# Patient Record
Sex: Female | Born: 1950 | Race: Black or African American | Hispanic: No | Marital: Married | Smoking: Never smoker
Health system: Southern US, Community
[De-identification: ages and names within clinical notes are randomized; demographics above are authoritative.]

## PROBLEM LIST (undated history)

## (undated) DIAGNOSIS — R519 Headache, unspecified: Secondary | ICD-10-CM

## (undated) DIAGNOSIS — D573 Sickle-cell trait: Secondary | ICD-10-CM

## (undated) DIAGNOSIS — E039 Hypothyroidism, unspecified: Secondary | ICD-10-CM

## (undated) DIAGNOSIS — I10 Essential (primary) hypertension: Secondary | ICD-10-CM

## (undated) DIAGNOSIS — R42 Dizziness and giddiness: Secondary | ICD-10-CM

## (undated) DIAGNOSIS — Z86718 Personal history of other venous thrombosis and embolism: Secondary | ICD-10-CM

## (undated) DIAGNOSIS — D649 Anemia, unspecified: Secondary | ICD-10-CM

## (undated) DIAGNOSIS — F32A Depression, unspecified: Secondary | ICD-10-CM

## (undated) DIAGNOSIS — C801 Malignant (primary) neoplasm, unspecified: Secondary | ICD-10-CM

## (undated) DIAGNOSIS — G629 Polyneuropathy, unspecified: Secondary | ICD-10-CM

## (undated) DIAGNOSIS — R51 Headache: Secondary | ICD-10-CM

## (undated) DIAGNOSIS — I2699 Other pulmonary embolism without acute cor pulmonale: Secondary | ICD-10-CM

## (undated) DIAGNOSIS — F419 Anxiety disorder, unspecified: Secondary | ICD-10-CM

## (undated) DIAGNOSIS — F329 Major depressive disorder, single episode, unspecified: Secondary | ICD-10-CM

## (undated) DIAGNOSIS — G709 Myoneural disorder, unspecified: Secondary | ICD-10-CM

## (undated) DIAGNOSIS — K219 Gastro-esophageal reflux disease without esophagitis: Secondary | ICD-10-CM

## (undated) DIAGNOSIS — M1990 Unspecified osteoarthritis, unspecified site: Secondary | ICD-10-CM

## (undated) HISTORY — PX: DIAGNOSTIC LAPAROSCOPY: SUR761

## (undated) HISTORY — PX: KNEE ARTHROSCOPY: SHX127

## (undated) HISTORY — PX: TONSILLECTOMY: SUR1361

## (undated) HISTORY — PX: REPLACEMENT TOTAL KNEE: SUR1224

## (undated) HISTORY — PX: DILATION AND CURETTAGE OF UTERUS: SHX78

## (undated) HISTORY — PX: FOOT SURGERY: SHX648

## (undated) HISTORY — PX: COLONOSCOPY: SHX174

---

## 2001-05-30 HISTORY — PX: ESOPHAGOGASTRODUODENOSCOPY: SHX1529

## 2005-01-14 ENCOUNTER — Emergency Department: Payer: Self-pay | Admitting: Emergency Medicine

## 2005-08-10 ENCOUNTER — Ambulatory Visit: Payer: Self-pay | Admitting: Obstetrics and Gynecology

## 2005-12-09 ENCOUNTER — Ambulatory Visit: Payer: Self-pay | Admitting: Psychiatry

## 2006-02-28 ENCOUNTER — Ambulatory Visit: Payer: Self-pay | Admitting: Surgery

## 2006-03-27 ENCOUNTER — Ambulatory Visit: Payer: Self-pay | Admitting: Gerontology

## 2006-04-04 ENCOUNTER — Ambulatory Visit: Payer: Self-pay | Admitting: Gerontology

## 2006-07-12 ENCOUNTER — Other Ambulatory Visit: Payer: Self-pay

## 2006-07-13 ENCOUNTER — Ambulatory Visit: Payer: Self-pay | Admitting: Unknown Physician Specialty

## 2006-07-26 ENCOUNTER — Ambulatory Visit: Payer: Self-pay | Admitting: Internal Medicine

## 2006-07-26 ENCOUNTER — Inpatient Hospital Stay: Payer: Self-pay | Admitting: Internal Medicine

## 2007-01-01 ENCOUNTER — Ambulatory Visit: Payer: Self-pay | Admitting: Unknown Physician Specialty

## 2007-01-01 ENCOUNTER — Other Ambulatory Visit: Payer: Self-pay

## 2007-01-01 ENCOUNTER — Ambulatory Visit: Payer: Self-pay | Admitting: Obstetrics and Gynecology

## 2007-02-21 ENCOUNTER — Ambulatory Visit: Payer: Self-pay | Admitting: Internal Medicine

## 2007-02-26 ENCOUNTER — Other Ambulatory Visit: Payer: Self-pay

## 2007-02-26 ENCOUNTER — Inpatient Hospital Stay: Payer: Self-pay | Admitting: Internal Medicine

## 2007-03-14 ENCOUNTER — Ambulatory Visit: Payer: Self-pay | Admitting: Internal Medicine

## 2007-09-06 ENCOUNTER — Ambulatory Visit: Payer: Self-pay | Admitting: Internal Medicine

## 2007-09-12 ENCOUNTER — Ambulatory Visit: Payer: Self-pay | Admitting: Unknown Physician Specialty

## 2008-01-02 ENCOUNTER — Ambulatory Visit: Payer: Self-pay | Admitting: Obstetrics and Gynecology

## 2009-02-25 ENCOUNTER — Ambulatory Visit: Payer: Self-pay | Admitting: Internal Medicine

## 2009-07-28 ENCOUNTER — Emergency Department: Payer: Self-pay | Admitting: Emergency Medicine

## 2009-09-03 ENCOUNTER — Ambulatory Visit: Payer: Self-pay | Admitting: Neurology

## 2010-03-25 ENCOUNTER — Ambulatory Visit: Payer: Self-pay | Admitting: Obstetrics and Gynecology

## 2010-04-06 ENCOUNTER — Ambulatory Visit: Payer: Self-pay | Admitting: Obstetrics and Gynecology

## 2010-06-07 ENCOUNTER — Ambulatory Visit: Payer: Self-pay

## 2010-09-02 ENCOUNTER — Emergency Department: Payer: Self-pay | Admitting: Emergency Medicine

## 2010-09-04 ENCOUNTER — Emergency Department: Payer: Self-pay | Admitting: Emergency Medicine

## 2011-06-27 ENCOUNTER — Ambulatory Visit: Payer: Self-pay | Admitting: Vascular Surgery

## 2011-06-27 LAB — PROTIME-INR: INR: 2.3

## 2011-07-03 ENCOUNTER — Ambulatory Visit: Payer: Self-pay | Admitting: General Practice

## 2011-07-03 LAB — CBC
HCT: 38 % (ref 35.0–47.0)
HGB: 12.6 g/dL (ref 12.0–16.0)
MCH: 27.4 pg (ref 26.0–34.0)
MCV: 83 fL (ref 80–100)
Platelet: 240 10*3/uL (ref 150–440)
WBC: 4.8 10*3/uL (ref 3.6–11.0)

## 2011-07-03 LAB — URINALYSIS, COMPLETE
Blood: NEGATIVE
Ketone: NEGATIVE
Nitrite: NEGATIVE
Ph: 6 (ref 4.5–8.0)
Protein: NEGATIVE
RBC,UR: 2 /HPF (ref 0–5)
Specific Gravity: 1.011 (ref 1.003–1.030)
Squamous Epithelial: 6
WBC UR: 5 /HPF (ref 0–5)

## 2011-07-03 LAB — BASIC METABOLIC PANEL
BUN: 17 mg/dL (ref 7–18)
Calcium, Total: 10.5 mg/dL — ABNORMAL HIGH (ref 8.5–10.1)
Chloride: 106 mmol/L (ref 98–107)
Co2: 29 mmol/L (ref 21–32)
EGFR (Non-African Amer.): 39 — ABNORMAL LOW
Potassium: 3.9 mmol/L (ref 3.5–5.1)

## 2011-07-03 LAB — SEDIMENTATION RATE: Erythrocyte Sed Rate: 37 mm/hr — ABNORMAL HIGH (ref 0–30)

## 2011-07-03 LAB — PROTIME-INR
INR: 2.3
Prothrombin Time: 25.7 secs — ABNORMAL HIGH (ref 11.5–14.7)

## 2011-07-03 LAB — APTT: Activated PTT: 42.3 secs — ABNORMAL HIGH (ref 23.6–35.9)

## 2011-07-03 LAB — MRSA PCR SCREENING

## 2011-07-13 ENCOUNTER — Ambulatory Visit: Payer: Self-pay | Admitting: Obstetrics and Gynecology

## 2011-07-17 ENCOUNTER — Inpatient Hospital Stay: Payer: Self-pay | Admitting: General Practice

## 2011-07-17 LAB — PROTIME-INR
INR: 1
Prothrombin Time: 13.7 secs (ref 11.5–14.7)

## 2011-07-18 LAB — BASIC METABOLIC PANEL
Anion Gap: 5 — ABNORMAL LOW (ref 7–16)
BUN: 10 mg/dL (ref 7–18)
Co2: 30 mmol/L (ref 21–32)
Creatinine: 1.04 mg/dL (ref 0.60–1.30)
EGFR (African American): >60
Osmolality: 283 (ref 275–301)

## 2011-07-18 LAB — PROTIME-INR: Prothrombin Time: 15.4 secs — ABNORMAL HIGH (ref 11.5–14.7)

## 2011-07-18 LAB — HEMOGLOBIN: HGB: 10.5 g/dL — ABNORMAL LOW (ref 12.0–16.0)

## 2011-07-19 LAB — BASIC METABOLIC PANEL
BUN: 7 mg/dL (ref 7–18)
Chloride: 105 mmol/L (ref 98–107)
Creatinine: 0.95 mg/dL (ref 0.60–1.30)
EGFR (African American): >60
Sodium: 142 mmol/L (ref 136–145)

## 2011-07-19 LAB — PLATELET COUNT: Platelet: 207 10*3/uL (ref 150–440)

## 2011-07-19 LAB — HEMOGLOBIN: HGB: 10.8 g/dL — ABNORMAL LOW (ref 12.0–16.0)

## 2011-07-20 LAB — BASIC METABOLIC PANEL
Calcium, Total: 8.3 mg/dL — ABNORMAL LOW (ref 8.5–10.1)
Chloride: 104 mmol/L (ref 98–107)
Co2: 33 mmol/L — ABNORMAL HIGH (ref 21–32)
Glucose: 107 mg/dL — ABNORMAL HIGH (ref 65–99)
Osmolality: 283 (ref 275–301)
Potassium: 3.8 mmol/L (ref 3.5–5.1)
Sodium: 143 mmol/L (ref 136–145)

## 2011-07-20 LAB — PROTIME-INR: Prothrombin Time: 20.7 secs — ABNORMAL HIGH (ref 11.5–14.7)

## 2011-09-12 ENCOUNTER — Ambulatory Visit: Payer: Self-pay | Admitting: Vascular Surgery

## 2011-09-12 LAB — PROTIME-INR
INR: 2
Prothrombin Time: 22.7 secs — ABNORMAL HIGH (ref 11.5–14.7)

## 2012-05-29 DIAGNOSIS — C801 Malignant (primary) neoplasm, unspecified: Secondary | ICD-10-CM

## 2012-05-29 HISTORY — DX: Malignant (primary) neoplasm, unspecified: C80.1

## 2012-05-29 HISTORY — PX: BREAST SURGERY: SHX581

## 2012-07-24 ENCOUNTER — Ambulatory Visit: Payer: Self-pay | Admitting: Obstetrics and Gynecology

## 2012-07-27 ENCOUNTER — Ambulatory Visit: Payer: Self-pay | Admitting: Internal Medicine

## 2012-07-29 ENCOUNTER — Ambulatory Visit: Payer: Self-pay | Admitting: Obstetrics and Gynecology

## 2012-08-23 ENCOUNTER — Ambulatory Visit: Payer: Self-pay | Admitting: Internal Medicine

## 2012-08-27 ENCOUNTER — Ambulatory Visit: Payer: Self-pay | Admitting: Internal Medicine

## 2014-04-18 ENCOUNTER — Emergency Department: Payer: Self-pay | Admitting: Emergency Medicine

## 2014-04-28 ENCOUNTER — Ambulatory Visit: Payer: Self-pay | Admitting: Anesthesiology

## 2014-04-28 DIAGNOSIS — Z01810 Encounter for preprocedural cardiovascular examination: Secondary | ICD-10-CM

## 2014-04-28 DIAGNOSIS — I10 Essential (primary) hypertension: Secondary | ICD-10-CM

## 2014-04-28 LAB — PROTIME-INR
INR: 2.1
Prothrombin Time: 23.1 secs — ABNORMAL HIGH (ref 11.5–14.7)

## 2014-04-28 LAB — POTASSIUM: POTASSIUM: 4 mmol/L (ref 3.5–5.1)

## 2014-05-11 ENCOUNTER — Ambulatory Visit: Payer: Self-pay | Admitting: General Practice

## 2014-05-11 LAB — PROTIME-INR
INR: 1.1
PROTHROMBIN TIME: 13.9 s (ref 11.5–14.7)

## 2014-09-19 NOTE — Op Note (Signed)
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   PROCEDURE PERFORMED: Right knee arthroscopy and extensive synovectomy.   SURGEON: Skip Estimable, MD   ANESTHESIA: General.   ESTIMATED BLOOD LOSS: Minimal.   TOURNIQUET TIME: Not used.   DRAINS: None.   INDICATIONS FOR SURGERY: The patient is a 64 year old female who underwent a right total knee arthroplasty several years ago. Serial x-rays have been unremarkable. However, she has developed crepitance with range of motion. She did not see any significant improvement despite attempts at quadriceps strengthening and other conservative measures. After discussion of the risks and benefits of surgical intervention, the patient expressed understanding of the risks and benefits and agreed with plans for surgical intervention.   PROCEDURE IN DETAIL: The patient was brought to the operating room and, after adequate general anesthesia was achieved, a tourniquet was placed on the patient's right thigh and leg was placed in a leg holder. All bony prominences were well padded. The patient's right knee and leg were cleaned and prepped with alcohol and DuraPrep and draped in the usual sterile fashion. A "timeout" was performed as per usual protocol. The anticipated portal sites were injected with 0.25% Marcaine with epinephrine. An anterolateral portal was created and a cannula was inserted. No significant effusion was appreciated. The scope was inserted and the knee was distended with fluid using the pump. Under visualization with the scope, an anteromedial portal was created and a 4.5 mm incisor shaver was inserted. There was noted to be some thickened fibrotic synovial tissue along the anterior aspect of the medial and  lateral gutters. These areas were debrided using a combination of 4.5 mm shaver with hemostasis achieved using the 50 degree ArthroCare wand. The implants were inspected and no unusual findings were appreciated. The knee was extended and significant amount of synovial tissue was noted about the patella. This was carefully debrided using a combination of a 4.5 mm shaver and the 50 degree ArthroCare wand. Most notably was an area to the superior aspect of the patellar component, which extended down and could be seen becoming entrapped within the notch of the implant. The synovial tissue was debrided using a combination of 4.5 mm incisor shaver and 50 degree ArthroCare wand. Excellent patellar tracking was appreciated. Both the medial and lateral gutters were again visualized with no significant fibrotic or synovial hypertrophy remaining. The wound was irrigated with copious amounts of fluid and then suctioned dry. The anteromedial portal was reapproximated using #3-0 nylon. A combination of 0.25% Marcaine with epinephrine and 4 mg of morphine was injected via the scope. The scope was removed and the anterolateral portal was reapproximated using #3-0 nylon. A sterile dressing was applied followed by application of an ice wrap.   The patient tolerated the procedure well. She was transported to the recovery room in stable condition.  ____________________________ Laurice Record. Holley Bouche., MD jph:sb D: 05/12/2014 06:28:02 ET T: 05/12/2014 09:19:15 ET JOB#: [id number]  cc: Laurice Record. Holley Bouche., MD, <Dictator> Laurice Record Holley Bouche MD ELECTRONICALLY SIGNED 05/12/2014 19:46

## 2014-09-20 NOTE — Discharge Summary (Signed)
PATIENT NAME:  Alexis Atkins, Alexis Atkins MR#:  [id number] DATE OF BIRTH:  1950/06/13  DATE OF ADMISSION:  07/17/2011 DATE OF DISCHARGE:  07/21/2011   ADMITTING DIAGNOSIS: Degenerative arthrosis of right knee.   DISCHARGE DIAGNOSIS: Degenerative arthrosis of right knee.   HISTORY: The patient is a 64 year old female who has been followed at [facility] for progression of right knee pain. She reported a several year history of bilateral knee pain with the right being more symptomatic than the left. The patient had previously underwent a right knee arthroscopy for partial medial and lateral meniscectomy as well as chondroplasty in 2008 performed by Dr. Albesa Seen. 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 Her postop course was complicated secondary to deep vein thrombosis as well as a pulmonary embolus. The patient states that the pain has increased in severity since that time. She localized most of the pain along the medial joint line as well as along the anterior aspect of the knee. She was having difficulty with ascending and descending stairs. She also complained of difficulty getting up and down out of a chair. She does report some crepitus with range of motion. At the time of surgery, she was not using any ambulatory aid. The patient has noted some swelling in the knee as well as near giving way. The pain had progressed to the point that it was significantly interfering with her activities of daily living. The patient did not see any significant improvement in her condition despite cortisone injections as well as Synvisc injections. X-rays taken in the clinic showed narrowing of the medial cartilage space with associated varus alignment. She was noted to have posterior osteophyte formation. Subchondral sclerosis was noted. There was significant narrowing to the patellofemoral articulation specifically along the lateral facet  region. After discussion of the risks and benefits of surgical intervention, the patient expressed her understanding of the risks and benefits and agreed with plans for surgical intervention.   PROCEDURE: Right total knee arthroplasty using computer-assisted navigation.   ANESTHESIA: Femoral nerve block with spinal.   SOFT TISSUE RELEASE: Anterior cruciate ligament, posterior cruciate ligament, deep medial collateral ligaments as well as the patellofemoral ligament.   IMPLANTS UTILIZED: DePuy PFC Sigma size 3 posterior stabilized femoral component (cemented), size 3 MBT tibial component (cemented), 35 mm three pegged oval dome patella (cemented), and a 10 mm stabilized rotating platform polyethylene insert.   HOSPITAL COURSE: The patient tolerated the procedure very well. She had no complications. She was then taken to PAC-U where she was stabilized and then transferred to the orthopedic floor. The patient began receiving anticoagulation therapy of Lovenox 30 mg sub-Q q.12 hours per anesthesia protocol as well as 10 mg of Coumadin. Her INR was monitored. This slowly returned to a therapeutic level of greater than 2.0 on discharge. Subsequently, her Lovenox was discontinued. She resumed taking her regular regimen of Coumadin upon discharge. The patient was fitted with TED stockings bilaterally. These were allowed to be removed one hour per eight hour shift. She was also fitted with AVI compression foot pumps bilaterally set at 80 mmHg. Calves have been nontender and free of any evidence of any deep venous thromboses. Heels were elevated off the bed using rolled towels. Heels have been nontender with no tissue breakdown noted.   The patient has denied any chest pain or shortness of breath. Vital signs have been stable. She has been afebrile. Hemodynamically she was  stable. No transfusions were given other than the Autovac transfusions given the first six hours postoperatively.   Physical therapy was  initiated on day one for gait training and transfers. Initially she was noted to have very good range of motion but very slow on progression of ambulation. However, upon being discharged she was ambulating greater than 260 feet. She was independent with bed-to-chair transfers. She was able to go up and down four sets of steps.   Occupational therapy was also initiated on day one for activities of daily living and assistive devices.   The patient was noted be somewhat lethargic during the hospital stay. Subsequently, her Ultram was discontinued. The husband felt that this was the cause of some of her drowsiness. After discontinuing this did not appear to change a lot in the way her mental status with discontinuation of the Ultram.   The patient's IV, Foley, and catheter were all removed on day two along with a dressing change. The Polar Care was reapplied to the surgical leg maintaining a temperature of 40 to 50 degrees Fahrenheit. There was no evidence or sign of any infection.   DISPOSITION: The patient is being discharged to home in improved stable condition.   DISCHARGE INSTRUCTIONS:  1. She will continue weightbearing as tolerated.  2. Continue using a walker until cleared by physical therapy to go to a quad cane.  3. She will receive home health PT.  4. She has a follow-up appointment on March 5th. She is to call the clinic sooner if any temperatures of 101.5 or greater or excessive bleeding.  5. She is to continue with TED stockings. These are to be worn during the day but may be removed at night.  6. Continue Polar Care maintaining a temperature of 40 to 50 degrees Fahrenheit.  7. She is placed on a regular diet.  8. She is to resume her regular medication that she was on prior to admission. She was given a prescription for oxycodone 5 to 10 mg q.4 to 6 hours p.r.n. for pain.   PAST MEDICAL HISTORY:  1. Chickenpox. 2. Depression. 3. DVT. 4. Pulmonary embolus.   5. Hypertension. 6. Migraine headaches.  7. Hypothyroidism.   ____________________________ Vance Peper, PA jrw:drc D: 07/21/2011 07:10:32 ET T: 07/21/2011 09:40:27 ET JOB#: [id number]  cc: Vance Peper, PA, <Dictator> Sabria Florido PA ELECTRONICALLY SIGNED 07/22/2011 20:05

## 2014-09-20 NOTE — Op Note (Signed)
PATIENT NAME:  Alexis Atkins, Alexis Atkins MR#:  [id number] DATE OF BIRTH:  Apr 12, 1951  DATE OF PROCEDURE:  09/12/2011  PREOPERATIVE DIAGNOSIS: Deep venous thrombosis with pulmonary embolus.   POSTOPERATIVE DIAGNOSIS: Deep venous thrombosis with pulmonary embolus.  PROCEDURES PERFORMED:  1. Inferior venacavogram.  2. Removal of infrarenal inferior vena caval filter, Meridian type.   SURGEON: Katha Cabal, MD   SEDATION: Versed 4 mg plus fentanyl 150 mcg administered IV. Continuous ECG, pulse oximetry and cardiopulmonary monitoring is performed throughout the entire procedure by the Interventional Radiology nurse. Total sedation time was 30 minutes.   ACCESS: 10-French  sheath, right IJ.   FLUOROSCOPY TIME:  2.3 minutes.   CONTRAST USED: Isovue 15 mL.   INDICATIONS: [name] is a 64 year old woman who suffered pulmonary embolism associated with a deep venous thrombosis several months ago. She has done well since that time. An IVC filter was placed to prevent further potentially lethal pulmonary emboli, and it is now to be removed to prevent future complications. The risks and benefits were reviewed. All questions were answered. The patient has agreed to proceed.   DESCRIPTION OF PROCEDURE: The patient is taken to Special Procedures and placed in the supine position. After adequate sedation is achieved, the right neck is prepped and draped in a sterile fashion. Ultrasound is placed in a sterile sleeve. Ultrasound is utilized secondary to a lack of appropriate landmarks and to avoid vascular injury. Under direct ultrasound visualization, the jugular vein is identified. It is echolucent, homogeneous, and easily compressible indicating patency. Image is recorded for the permanent record. Under real-time ultrasound guidance, a Seldinger needle is inserted into the jugular vein. The J-wire is then advanced under fluoroscopy without difficulty, and a retrieval sheath is advanced past the filter down to the  iliac confluence. Bolus injection of contrast is then utilized to demonstrate the inferior vena cava and filter. Filter's orientation is upright, and there is no evidence of thrombus within the filter or within the IVC and visualized portions of the iliac veins.   The sheath is repositioned. The snare is extended. It is used to loop the retrieval hook, and the filter subsequently collapsed without difficulty and is removed. Pressure is held at the sheath insertion site in the neck, and there are no immediate complications.   INTERPRETATION: The inferior vena cava is widely patent. No evidence of thrombus. The filter is in good orientation.   SUMMARY: Successful removal of infrarenal IVC filter.   ____________________________ Katha Cabal, MD ggs:cbb D: 09/12/2011 10:45:10 ET T: 09/12/2011 11:12:19 ET JOB#: [id number]  cc: Katha Cabal, MD, <Dictator> Katha Cabal MD ELECTRONICALLY SIGNED 09/17/2011 13:55

## 2014-09-20 NOTE — Op Note (Signed)
PATIENT NAME:  [name], [name] MR#:  [id number] DATE OF BIRTH:  [birth year]  DATE OF PROCEDURE:  06/27/2011  PREOPERATIVE DIAGNOSES:  1. History of deep venous thrombosis.  2. History of bilateral severe pulmonary embolisms.  3. Degenerative joint disease requiring knee surgery, high risk procedure.   POSTOPERATIVE DIAGNOSES:  1. History of deep venous thrombosis.  2. History of bilateral severe pulmonary embolisms.  3. Degenerative joint disease requiring knee surgery, high risk procedure.   PROCEDURES PERFORMED:  1. Inferior venacavogram.  2. Placement of infrarenal inferior vena caval filter.   SURGEON: Katha Cabal, MD  SEDATION: Versed 3 mg with fentanyl 100 mcg administered IV. Continuous ECG, pulse oximetry and cardiopulmonary monitoring was performed throughout the entire procedure by the interventional radiology nurse. Total sedation time was 45 minutes.   ACCESS: 9.5 French sheath, right common femoral vein.   CONTRAST USED: Isovue 15 mL.   FLUOROSCOPY TIME: 0.6 minutes.   INDICATIONS: Ms. Alexis Atkins is a 64 year old woman who will require knee surgery which is high risk for recurrent thrombotic episodes. 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 She is therefore requiring placement of an IVC filter for protection from lethal pulmonary embolism in perioperative procedure. She has been informed that the plan will be to remove this filter in approximately four months. She agrees with this plan.   DESCRIPTION OF PROCEDURE: Patient is taken to special procedures, placed in supine position. After adequate sedation is achieved, right groin is prepped and draped in sterile fashion. Ultrasound is placed in a sterile sleeve. Femoral vein is identified. It is echolucent and compressible, indicating patency. Image is recorded for the permanent record. Under direct ultrasound visualization after 1% lidocaine has been infiltrated, Seldinger needle is inserted into the  anterior wall of the common femoral vein. J-wire is then easily advanced. Dilator is advanced up to the iliac confluence and bolus injection of contrast used to demonstrate the inferior vena cava. After evaluation of the image the wire is reintroduced. The sheath is repositioned to the L3 level and the filter is then deployed at the L3 level based on the location of the renal blushes. Filter is in good orientation. Filter is removed, pressure is held. A safeguard is placed. There are no immediate complications.   INTERPRETATION: Inferior vena cava is opacified with a bolus injection of contrast, appears patent, renal blushes are at the L2 level. Filter is deployed at the L3 level. Cava measures approximately 20 mm in diameter.   SUMMARY: Successful placement of infrarenal inferior vena caval filter.   ____________________________ Katha Cabal, MD ggs:cms D: 06/27/2011 08:55:54 ET T: 06/27/2011 10:17:44 ET JOB#: [id number]  cc: Katha Cabal, MD, <Dictator> Katha Cabal MD ELECTRONICALLY SIGNED 07/12/2011 11:24

## 2014-09-20 NOTE — Op Note (Signed)
PATIENT NAME:  Alexis Atkins, Alexis Atkins MR#:  [id number] DATE OF BIRTH:  1951-02-20  DATE OF PROCEDURE:  07/17/2011  PREOPERATIVE DIAGNOSIS: Degenerative arthrosis of the right knee.   POSTOPERATIVE DIAGNOSIS: Degenerative arthrosis of the right knee.   PROCEDURE PERFORMED: Right total knee arthroplasty using computer-assisted navigation.   SURGEON:  Laurice Record. Holley Bouche., MD   ASSISTANT: Vance Peper, PA-C (required to maintain retraction throughout the procedure)   ANESTHESIA: Femoral nerve block and spinal.   ESTIMATED BLOOD LOSS: 50 mL.   FLUIDS REPLACED: 1000 mL of crystalloid.   TOURNIQUET TIME: 81 minutes.   DRAINS: Two medium drains to reinfusion system.   SOFT TISSUE RELEASES: Anterior cruciate ligament, posterior cruciate ligament, deep medial collateral ligament, patellofemoral ligament.   IMPLANTS UTILIZED: DePuy PFC Sigma size 3 posterior stabilized femoral component (cemented), size 3 MBT tibial component (cemented), 35 mm three-peg oval dome patella (cemented), and a 10 mm stabilized rotating platform polyethylene insert.   INDICATIONS FOR SURGERY: The patient is a 64 year old female who has been seen for complaints of progressive right knee pain. X-rays demonstrated significant degenerative changes involving the medial and the patellofemoral compartments. She did not see any significant improvement despite conservative nonsurgical intervention. After discussion of the risks and benefits of surgical intervention, the patient expressed her understanding of the risks and benefits and agreed with plans for surgical intervention.   PROCEDURE IN DETAIL: The patient was brought into the Operating Room, and after adequate femoral nerve block and spinal anesthesia was achieved, a tourniquet was placed on the patient's upper right thigh. The patient's right knee and leg were cleaned and prepped with alcohol and DuraPrep and draped in the usual sterile fashion. A "timeout" was performed as per  usual protocol. The right lower extremity was exsanguinated using an Esmarch, and the tourniquet was inflated to 300 mmHg. An anterior longitudinal incision was made, followed by a standard mid vastus approach. A moderate effusion was evacuated. Deep fibers of the medial and collateral ligament were elevated in a subperiosteal fashion off the medial flare of the tibia so as to maintain a continuous soft tissue sleeve. The patella was subluxed laterally, and the patellofemoral ligament was incised. Inspection of the knee demonstrated severe degenerative changes to the intercondylar notch as well as to the patella and medial compartment. Anterior and posterior cruciate ligaments were excised. Prominent osteophytes were debrided using a rongeur. Two 4.0 mm Schanz pins were inserted into the femur and into the tibia for attachment of the array of spheres used for computer-assisted navigation. Hip center was identified using a circumduction technique. Distal landmarks were mapped using the computer. The distal femur and proximal tibia were mapped using the computer. A distal femoral cutting guide was positioned using computer-assisted navigation so as to achieve a 5-degree distal valgus cut. Cut was performed and verified using the computer. The distal femur was sized, and it was felt that a size 3 femoral component was appropriate. A size 3 cutting guide was positioned using computer-assisted navigation, and the anterior cut was performed and verified using the computer. This was followed by completion of the posterior and chamfer cuts. A femoral cutting guide for the central box was then positioned, and the central box cut was performed.   Attention was then directed to the proximal tibia. The medial and lateral menisci were excised. The extramedullary tibial cutting guide was positioned using computer-assisted navigation so as to achieve 0-degree varus-valgus alignment and 0-degree posterior slope. A cut was  performed and verified  using the computer. The proximal tibia was sized, and it was felt that a size 3 tray was appropriate. Size 3 tibial and femoral trials were placed, followed by insertion of a 10 mm polyethylene insert. Excellent medial and lateral soft tissue balancing was appreciated both in full extension and in 90 degrees of flexion. Finally, the patella was cut and prepared so as to accommodate a 35 mm three-peg oval dome patella. A patellar trial was placed, and the knee was placed through a range of motion with excellent patellar tracking appreciated.   The femoral trial was removed. A central posthole for the tibial component was reamed, followed by insertion of a keel punch. The tibial trial was then removed. The cut surfaces of bone were irrigated with copious amounts of normal saline with antibiotic solution using pulsatile lavage and then suctioned dry. Polymethylmethacrylate cement was prepared in the usual fashion using a vacuum mixer. Cement was applied to the cut surface of the proximal tibia as well as along the undersurface of a size 3 MBT tibial component. The tibial component was positioned and impacted into place. Excess cement was removed using freer elevators. Cement was then applied to the cut surface of the femur as well as along the posterior flanges of a size 3 posterior stabilized femoral component. The femoral component was positioned and impacted into place. Excess cement was removed using freer elevators. A 10 mm polyethylene trial was inserted, and the knee was brought in full extension with steady axial compression applied. Finally, cement was applied to the backside of a 35 mm three-peg oval dome patella, and the patellar component was positioned and patellar clamp applied. Excess cement was removed using freer elevators.   After adequate curing of cement, the tourniquet was deflated after a total tourniquet time of 81 minutes. Hemostasis was achieved using electrocautery.  The knee was irrigated with copious amounts of normal saline with antibiotic solution using pulsatile lavage and then suctioned dry. The knee was inspected for any residual cement debris; 30 mL of 0.25% Marcaine with epinephrine was injected along the posterior capsule. A 10 mm stabilized rotating platform polyethylene insert was inserted, and the knee was placed through a range of motion. Again, excellent medial and lateral soft tissue balancing was appreciated and excellent patellar tracking appreciated. Two medium drains were placed in the wound bed and brought out through a separate stab incision to be attached to a reinfusion system. The medial and parapatellar portion of the incision was reapproximated using interrupted sutures of #1 Vicryl. The subcutaneous tissue was approximated in layers using first #0 Vicryl, followed by #2-0 Vicryl. The skin was closed with skin staples. A sterile dressing was applied.   The patient tolerated the procedure well. She was transported to the recovery room in stable condition.    ____________________________ Laurice Record. Holley Bouche., MD jph:cbb D: 07/17/2011 10:45:44 ET T: 07/17/2011 11:48:11 ET JOB#: [id number]  cc: Jeneen Rinks P. Holley Bouche., MD, <Dictator> Laurice Record Holley Bouche MD ELECTRONICALLY SIGNED 07/20/2011 19:06

## 2015-07-15 ENCOUNTER — Other Ambulatory Visit: Payer: Self-pay | Admitting: Internal Medicine

## 2015-07-15 DIAGNOSIS — R1012 Left upper quadrant pain: Secondary | ICD-10-CM

## 2015-07-19 ENCOUNTER — Ambulatory Visit
Admission: RE | Admit: 2015-07-19 | Discharge: 2015-07-19 | Disposition: A | Discharge status: Home / Self Care | Payer: Medicare Other | Source: Ambulatory Visit | Attending: Internal Medicine | Admitting: Internal Medicine

## 2015-07-19 DIAGNOSIS — R1012 Left upper quadrant pain: Secondary | ICD-10-CM | POA: Insufficient documentation

## 2015-07-19 MED ORDER — IOHEXOL 300 MG/ML  SOLN
125.0000 mL | Freq: Once | INTRAMUSCULAR | Status: AC | PRN
  Administered 2015-07-19: 125 mL via INTRAVENOUS

## 2015-07-23 ENCOUNTER — Ambulatory Visit: Payer: Medicare Other

## 2016-06-29 ENCOUNTER — Other Ambulatory Visit: Payer: Self-pay | Admitting: Internal Medicine

## 2016-06-29 DIAGNOSIS — G8929 Other chronic pain: Secondary | ICD-10-CM

## 2016-06-29 DIAGNOSIS — R51 Headache: Principal | ICD-10-CM

## 2016-07-11 ENCOUNTER — Ambulatory Visit
Admission: RE | Admit: 2016-07-11 | Discharge: 2016-07-11 | Disposition: A | Discharge status: Home / Self Care | Payer: Medicare Other | Source: Ambulatory Visit | Attending: Internal Medicine | Admitting: Internal Medicine

## 2016-07-11 DIAGNOSIS — J328 Other chronic sinusitis: Secondary | ICD-10-CM | POA: Insufficient documentation

## 2016-07-11 DIAGNOSIS — R51 Headache: Secondary | ICD-10-CM | POA: Insufficient documentation

## 2016-07-11 DIAGNOSIS — G8929 Other chronic pain: Secondary | ICD-10-CM

## 2016-07-17 NOTE — Discharge Instructions (Signed)

## 2016-07-18 ENCOUNTER — Ambulatory Visit: Payer: Medicare Other | Admitting: Anesthesiology

## 2016-07-18 ENCOUNTER — Ambulatory Visit
Admission: RE | Admit: 2016-07-18 | Discharge: 2016-07-18 | Disposition: A | Discharge status: Home / Self Care | Payer: Medicare Other | Source: Ambulatory Visit | Attending: Otolaryngology | Admitting: Otolaryngology

## 2016-07-18 ENCOUNTER — Encounter
Admission: RE | Disposition: A | Discharge status: Home / Self Care | Payer: Self-pay | Source: Ambulatory Visit | Attending: Otolaryngology

## 2016-07-18 DIAGNOSIS — Z833 Family history of diabetes mellitus: Secondary | ICD-10-CM | POA: Insufficient documentation

## 2016-07-18 DIAGNOSIS — G4489 Other headache syndrome: Secondary | ICD-10-CM

## 2016-07-18 DIAGNOSIS — Z79899 Other long term (current) drug therapy: Secondary | ICD-10-CM | POA: Insufficient documentation

## 2016-07-18 DIAGNOSIS — Z886 Allergy status to analgesic agent status: Secondary | ICD-10-CM | POA: Insufficient documentation

## 2016-07-18 DIAGNOSIS — Z885 Allergy status to narcotic agent status: Secondary | ICD-10-CM | POA: Diagnosis not present

## 2016-07-18 DIAGNOSIS — I10 Essential (primary) hypertension: Secondary | ICD-10-CM | POA: Insufficient documentation

## 2016-07-18 DIAGNOSIS — E0590 Thyrotoxicosis, unspecified without thyrotoxic crisis or storm: Secondary | ICD-10-CM | POA: Diagnosis not present

## 2016-07-18 DIAGNOSIS — Z888 Allergy status to other drugs, medicaments and biological substances status: Secondary | ICD-10-CM | POA: Diagnosis not present

## 2016-07-18 DIAGNOSIS — Z8249 Family history of ischemic heart disease and other diseases of the circulatory system: Secondary | ICD-10-CM | POA: Diagnosis not present

## 2016-07-18 DIAGNOSIS — D573 Sickle-cell trait: Secondary | ICD-10-CM | POA: Diagnosis not present

## 2016-07-18 HISTORY — DX: Sickle-cell trait: D57.3

## 2016-07-18 HISTORY — DX: Hypothyroidism, unspecified: E03.9

## 2016-07-18 HISTORY — DX: Headache: R51

## 2016-07-18 HISTORY — DX: Essential (primary) hypertension: I10

## 2016-07-18 HISTORY — DX: Headache, unspecified: R51.9

## 2016-07-18 HISTORY — DX: Personal history of other venous thrombosis and embolism: Z86.718

## 2016-07-18 HISTORY — PX: ARTERY BIOPSY: SHX891

## 2016-07-18 HISTORY — DX: Malignant (primary) neoplasm, unspecified: C80.1

## 2016-07-18 HISTORY — DX: Myoneural disorder, unspecified: G70.9

## 2016-07-18 HISTORY — DX: Dizziness and giddiness: R42

## 2016-07-18 SURGERY — BIOPSY TEMPORAL ARTERY
Anesthesia: Monitor Anesthesia Care | Site: Head | Laterality: Left | Wound class: Clean

## 2016-07-18 MED ORDER — PROPOFOL 10 MG/ML IV BOLUS
INTRAVENOUS | Status: DC | PRN
  Administered 2016-07-18: 150 mg via INTRAVENOUS
  Administered 2016-07-18: 50 mg via INTRAVENOUS

## 2016-07-18 MED ORDER — ACETAMINOPHEN 160 MG/5ML PO SOLN: 325.0000 mg | ORAL | Status: DC | PRN

## 2016-07-18 MED ORDER — DEXAMETHASONE SODIUM PHOSPHATE 4 MG/ML IJ SOLN
INTRAMUSCULAR | Status: DC | PRN
  Administered 2016-07-18: 8 mg via INTRAVENOUS

## 2016-07-18 MED ORDER — DOUBLE ANTIBIOTIC 500-10000 UNIT/GM EX OINT
TOPICAL_OINTMENT | CUTANEOUS | Status: DC | PRN
  Administered 2016-07-18: 1 via TOPICAL

## 2016-07-18 MED ORDER — FENTANYL CITRATE (PF) 100 MCG/2ML IJ SOLN
INTRAMUSCULAR | Status: DC | PRN
  Administered 2016-07-18 (×2): 25 ug via INTRAVENOUS

## 2016-07-18 MED ORDER — ACETAMINOPHEN 325 MG PO TABS: 325.0000 mg | ORAL_TABLET | ORAL | Status: DC | PRN

## 2016-07-18 MED ORDER — MIDAZOLAM HCL 5 MG/5ML IJ SOLN
INTRAMUSCULAR | Status: DC | PRN
  Administered 2016-07-18: 2 mg via INTRAVENOUS

## 2016-07-18 MED ORDER — LACTATED RINGERS IV SOLN: 500.0000 mL | INTRAVENOUS | Status: DC

## 2016-07-18 MED ORDER — LACTATED RINGERS IV SOLN
INTRAVENOUS | Status: DC
  Administered 2016-07-18: 08:00:00 via INTRAVENOUS

## 2016-07-18 MED ORDER — LIDOCAINE-EPINEPHRINE 1 %-1:100000 IJ SOLN
INTRAMUSCULAR | Status: DC | PRN
Start: 2016-07-18 — End: 2016-07-18
  Administered 2016-07-18: 1 mL

## 2016-07-18 MED ORDER — LIDOCAINE HCL (CARDIAC) 20 MG/ML IV SOLN
INTRAVENOUS | Status: DC | PRN
  Administered 2016-07-18: 50 mg via INTRATRACHEAL

## 2016-07-18 MED ORDER — ONDANSETRON HCL 4 MG/2ML IJ SOLN
INTRAMUSCULAR | Status: DC | PRN
  Administered 2016-07-18: 4 mg via INTRAVENOUS

## 2016-07-18 MED ORDER — FENTANYL CITRATE (PF) 100 MCG/2ML IJ SOLN: 25.0000 ug | INTRAMUSCULAR | Status: DC | PRN

## 2016-07-18 SURGICAL SUPPLY — 31 items
BACTOSHIELD CHG 4% 4OZ (MISCELLANEOUS)
BLADE SURG 15 STRL LF DISP TIS (BLADE) IMPLANT
BLADE SURG 15 STRL SS (BLADE)
CANISTER SUCT 1200ML W/VALVE (MISCELLANEOUS) ×3 IMPLANT
CORD BIP STRL DISP 12FT (MISCELLANEOUS) ×3 IMPLANT
DERMABOND ADVANCED (GAUZE/BANDAGES/DRESSINGS)
DERMABOND ADVANCED .7 DNX12 (GAUZE/BANDAGES/DRESSINGS) IMPLANT
DRESSING TELFA 4X3 1S ST N-ADH (GAUZE/BANDAGES/DRESSINGS) ×3 IMPLANT
DRSG TEGADERM 2-3/8X2-3/4 SM (GAUZE/BANDAGES/DRESSINGS) IMPLANT
ELECT CAUTERY NEEDLE 2.0 MIC (NEEDLE) IMPLANT
GAUZE SPONGE 4X4 12PLY STRL (GAUZE/BANDAGES/DRESSINGS) IMPLANT
GLOVE BIO SURGEON STRL SZ7.5 (GLOVE) ×6 IMPLANT
KIT ROOM TURNOVER OR (KITS) ×3 IMPLANT
NS IRRIG 500ML POUR BTL (IV SOLUTION) ×3 IMPLANT
PACK DRAPE NASAL/ENT (PACKS) ×3 IMPLANT
PAD GROUND ADULT SPLIT (MISCELLANEOUS) IMPLANT
PENCIL ELECTRO HAND CTR (MISCELLANEOUS) IMPLANT
SCRUB CHG 4% DYNA-HEX 4OZ (MISCELLANEOUS) IMPLANT
SOL PREP PVP 2OZ (MISCELLANEOUS) ×3
SOLUTION PREP PVP 2OZ (MISCELLANEOUS) ×1 IMPLANT
SPONGE KITTNER 5P (MISCELLANEOUS) ×3 IMPLANT
STRAP BODY AND KNEE 60X3 (MISCELLANEOUS) ×3 IMPLANT
SUCTION FRAZIER TIP 10 FR DISP (SUCTIONS) IMPLANT
SUT PLAIN GUT FAST 5-0 (SUTURE) ×3 IMPLANT
SUT SILK 3 0 (SUTURE) ×2
SUT SILK 3-0 18XBRD TIE 12 (SUTURE) ×1 IMPLANT
SUT VIC AB 4-0 FS2 27 (SUTURE) IMPLANT
SUT VIC AB 4-0 RB1 18 (SUTURE) IMPLANT
SUT VIC AB 4-0 RB1 27 (SUTURE) ×2
SUT VIC AB 4-0 RB1 27X BRD (SUTURE) ×1 IMPLANT
SYR BULB IRRIG 60ML STRL (SYRINGE) IMPLANT

## 2016-07-18 NOTE — Anesthesia Preprocedure Evaluation (Addendum)
Anesthesia Evaluation  Patient identified by MRN, date of birth, ID band Patient awake    Reviewed: Allergy & Precautions, H&P , NPO status , Patient's Chart, lab work & pertinent test results, reviewed documented beta blocker date and time   Airway Mallampati: III  TM Distance: >3 FB Neck ROM: full    Dental no notable dental hx.    Pulmonary neg pulmonary ROS,    Pulmonary exam normal breath sounds clear to auscultation       Cardiovascular Exercise Tolerance: Good hypertension, On Medications  Rhythm:regular Rate:Normal     Neuro/Psych negative neurological ROS  negative psych ROS   GI/Hepatic negative GI ROS, Neg liver ROS,   Endo/Other  Hypothyroidism   Renal/GU negative Renal ROS  negative genitourinary   Musculoskeletal   Abdominal   Peds  Hematology  (+) Blood dyscrasia, Sickle cell trait ,   Anesthesia Other Findings   Reproductive/Obstetrics negative OB ROS                            Anesthesia Physical Anesthesia Plan  ASA: III  Anesthesia Plan: General   Post-op Pain Management:    Induction:   Airway Management Planned:   Additional Equipment:   Intra-op Plan:   Post-operative Plan:   Informed Consent: I have reviewed the patients History and Physical, chart, labs and discussed the procedure including the risks, benefits and alternatives for the proposed anesthesia with the patient or authorized representative who has indicated his/her understanding and acceptance.     Plan Discussed with: CRNA  Anesthesia Plan Comments:       Anesthesia Quick Evaluation

## 2016-07-18 NOTE — Anesthesia Postprocedure Evaluation (Signed)
Anesthesia Post Note  Patient: Alexis Atkins  Procedure(s) Performed: Procedure(s) (LRB): BIOPSY TEMPORAL ARTERY (Left)  Patient location during evaluation: PACU Anesthesia Type: General Level of consciousness: awake and alert Pain management: pain level controlled Vital Signs Assessment: post-procedure vital signs reviewed and stable Respiratory status: spontaneous breathing, nonlabored ventilation, respiratory function stable and patient connected to nasal cannula oxygen Cardiovascular status: blood pressure returned to baseline and stable Postop Assessment: no signs of nausea or vomiting Anesthetic complications: no    Giovanna Kemmerer D Ariadne Rissmiller

## 2016-07-18 NOTE — Transfer of Care (Signed)
Immediate Anesthesia Transfer of Care Note  Patient: Alexis Atkins  Procedure(s) Performed: Procedure(s): BIOPSY TEMPORAL ARTERY (Left)  Patient Location: PACU  Anesthesia Type: MAC  Level of Consciousness: awake, alert  and patient cooperative  Airway and Oxygen Therapy: Patient Spontanous Breathing and Patient connected to supplemental oxygen  Post-op Assessment: Post-op Vital signs reviewed, Patient's Cardiovascular Status Stable, Respiratory Function Stable, Patent Airway and No signs of Nausea or vomiting  Post-op Vital Signs: Reviewed and stable  Complications: No apparent anesthesia complications

## 2016-07-18 NOTE — Anesthesia Procedure Notes (Signed)
Procedure Name: LMA Insertion Date/Time: 07/18/2016 9:16 AM Performed by: Londell Moh Pre-anesthesia Checklist: Patient identified, Emergency Drugs available, Suction available, Timeout performed and Patient being monitored Patient Re-evaluated:Patient Re-evaluated prior to inductionOxygen Delivery Method: Circle system utilized Preoxygenation: Pre-oxygenation with 100% oxygen Intubation Type: IV induction LMA: LMA inserted LMA Size: 4.0 Number of attempts: 1 Placement Confirmation: positive ETCO2 and breath sounds checked- equal and bilateral Tube secured with: Tape

## 2016-07-18 NOTE — H&P (Signed)
History and physical reviewed and will be scanned in later. No change in medical status reported by the patient or family, appears stable for surgery. All questions regarding the procedure answered, and patient (or family if a child) expressed understanding of the procedure.  Alexis Atkins @TODAY@ 

## 2016-07-18 NOTE — Op Note (Signed)
07/18/2016  10:23 AM    Alexis Atkins  TO:[id number]   Pre-Op Diagnosis:  Chronic left temporal headaches  Post-op Diagnosis: Same  Procedure:   Left temporal artery biopsy  Surgeon:  Riley Nearing  Anesthesia:  General endotracheal  EBL:  Minimal  Complications:  None  Findings: 2 cm segment of left temporal artery was biopsied.  Procedure: After the patient was identified in holding and the procedure was reviewed.  The patient was taken to the operating room and with the patient in a comfortable supine position,  general endotracheal anesthesia was induced without difficulty.  A proper time-out was performed, confirming the operative site and procedure.  The left temporal area was prepped and draped in the usual sterile fashion. A Doppler was then used to map out the course of the left temporal artery just at the hairline. 1% lidocaine with epinephrine 1 100,000 was injected into the skin over this region. A 15 blade was then used to incise the skin down to the subcutaneous fatty tissues. Tenotomies were then used to carefully dissect and locate the left temporal artery. A segment of the artery roughly 2 cm long was dissected out, clamped and suture ligated on either end, and sent as a specimen. The wound was irrigated and minor oozing controlled with the bipolar cautery. The wound was then closed with 4-0 Vicryl suture for the subcutaneous closure, followed by 5-0 fast absorbing gut suture for the skin. Bacitracin ointment was applied.  The patient was then returned to the anesthesiologist in good condition for awakening. The patient was awakened and taken to the recovery room in good condition.   Disposition:   PACU then discharge home  Plan: Polysporin to wound twice daily for 5-7 days. Sutures are absorbable. She can follow-up in 2 weeks for a wound check. Ice may be applied as needed and Tylenol taken as needed for pain.  Riley Nearing 07/18/2016 10:23 AM

## 2016-07-19 ENCOUNTER — Encounter: Payer: Self-pay | Admitting: Otolaryngology

## 2016-07-20 LAB — SURGICAL PATHOLOGY

## 2017-02-02 ENCOUNTER — Emergency Department
Admission: EM | Admit: 2017-02-02 | Discharge: 2017-02-02 | Disposition: A | Discharge status: Home / Self Care | Payer: Medicare Other | Attending: Emergency Medicine | Admitting: Emergency Medicine

## 2017-02-02 ENCOUNTER — Encounter: Payer: Self-pay | Admitting: Emergency Medicine

## 2017-02-02 ENCOUNTER — Emergency Department: Payer: Medicare Other

## 2017-02-02 DIAGNOSIS — Z96651 Presence of right artificial knee joint: Secondary | ICD-10-CM | POA: Insufficient documentation

## 2017-02-02 DIAGNOSIS — Z7901 Long term (current) use of anticoagulants: Secondary | ICD-10-CM | POA: Diagnosis not present

## 2017-02-02 DIAGNOSIS — R55 Syncope and collapse: Secondary | ICD-10-CM | POA: Diagnosis present

## 2017-02-02 DIAGNOSIS — E039 Hypothyroidism, unspecified: Secondary | ICD-10-CM | POA: Diagnosis not present

## 2017-02-02 DIAGNOSIS — I10 Essential (primary) hypertension: Secondary | ICD-10-CM | POA: Insufficient documentation

## 2017-02-02 DIAGNOSIS — R42 Dizziness and giddiness: Secondary | ICD-10-CM | POA: Diagnosis not present

## 2017-02-02 DIAGNOSIS — Z79899 Other long term (current) drug therapy: Secondary | ICD-10-CM | POA: Insufficient documentation

## 2017-02-02 LAB — BASIC METABOLIC PANEL
Anion gap: 8 (ref 5–15)
BUN: 12 mg/dL (ref 6–20)
CALCIUM: 9.3 mg/dL (ref 8.9–10.3)
CO2: 27 mmol/L (ref 22–32)
CREATININE: 0.93 mg/dL (ref 0.44–1.00)
Chloride: 109 mmol/L (ref 101–111)
GLUCOSE: 115 mg/dL — AB (ref 65–99)
Potassium: 4 mmol/L (ref 3.5–5.1)
Sodium: 144 mmol/L (ref 135–145)

## 2017-02-02 LAB — URINALYSIS, COMPLETE (UACMP) WITH MICROSCOPIC
Bacteria, UA: NONE SEEN
Bilirubin Urine: NEGATIVE
GLUCOSE, UA: NEGATIVE mg/dL
HGB URINE DIPSTICK: NEGATIVE
Ketones, ur: NEGATIVE mg/dL
Leukocytes, UA: NEGATIVE
NITRITE: NEGATIVE
Protein, ur: NEGATIVE mg/dL
SPECIFIC GRAVITY, URINE: 1.011 (ref 1.005–1.030)
Squamous Epithelial / LPF: NONE SEEN
pH: 7 (ref 5.0–8.0)

## 2017-02-02 LAB — TROPONIN I: Troponin I: <0.03 ng/mL (ref <0.03)

## 2017-02-02 LAB — CBC
HCT: 37.2 % (ref 35.0–47.0)
Hemoglobin: 12.7 g/dL (ref 12.0–16.0)
MCH: 27 pg (ref 26.0–34.0)
MCHC: 34.1 g/dL (ref 32.0–36.0)
MCV: 79.2 fL — ABNORMAL LOW (ref 80.0–100.0)
PLATELETS: 245 10*3/uL (ref 150–440)
RBC: 4.7 MIL/uL (ref 3.80–5.20)
RDW: 15.8 % — ABNORMAL HIGH (ref 11.5–14.5)
WBC: 4.7 10*3/uL (ref 3.6–11.0)

## 2017-02-02 LAB — GLUCOSE, CAPILLARY: Glucose-Capillary: 110 mg/dL — ABNORMAL HIGH (ref 65–99)

## 2017-02-02 MED ORDER — MECLIZINE HCL 25 MG PO TABS: 25.0000 mg | ORAL_TABLET | Freq: Three times a day (TID) | ORAL | 0 refills | Status: AC | PRN

## 2017-02-02 MED ORDER — ONDANSETRON 4 MG PO TBDP
4.0000 mg | ORAL_TABLET | Freq: Once | ORAL | Status: AC
  Administered 2017-02-02: 4 mg via ORAL
  Filled 2017-02-02: qty 1

## 2017-02-02 MED ORDER — MECLIZINE HCL 25 MG PO TABS
25.0000 mg | ORAL_TABLET | Freq: Once | ORAL | Status: AC
  Administered 2017-02-02: 25 mg via ORAL
  Filled 2017-02-02: qty 1

## 2017-02-02 MED ORDER — ONDANSETRON 4 MG PO TBDP: 4.0000 mg | ORAL_TABLET | Freq: Three times a day (TID) | ORAL | 0 refills | Status: AC | PRN

## 2017-02-02 NOTE — ED Notes (Signed)
E-signature pad not working at this time. Pt and pt husband verbalized understanding of RXs and d/c instructions and denies any questions at this time.

## 2017-02-02 NOTE — ED Notes (Signed)
Pt states that she was Dr. Sharyon Cable office for her 6 month follow up appt when she got nauseated and dizzy and felt like she was going to pass out. Pt states that she gets very dizzy when turning her head to the left. Pt has hx/o vertigo.

## 2017-02-02 NOTE — ED Notes (Signed)
Pt ambulatory to the restroom with assistance from her husband. Pt unsteady on feet. Requests that RN does not stay in room with while she is getting urine sample.

## 2017-02-02 NOTE — ED Provider Notes (Signed)
[facility] Emergency Department Provider Note  Time seen: 11:25 AM  I have reviewed the triage vital signs and the nursing notes.   HISTORY  Chief Complaint Near Syncope    HPI Alexis Atkins is a 66 y.o. female With a past medical history of migraines, vomiting and embolus on warfarin, hypertension, vertigo, presents to the emergency department for her nausea and dizziness and near syncope. According to the patient for the past 2 weeks she has been feeling quite dizzy. Patient does have a history of vertigo per record review. Patient denies any weakness or numbness, confusion headache or slurred speech. She states today she was at her doctor's office for a routine visit she began feeling dizzy and nauseated. Went to the bathroom because she felt like she was going to vomit and had a near syncopal versus brief syncopal event. Patient denies any chest pain or trouble breathing at any point. Patient states she has passed out multiple times in the past the last of which occurred approximately one year ago.currently the patient states mild dizziness with mild nausea.  Past Medical History:  Diagnosis Date  . Cancer [facility]) 2014   breast right side  . Headache    migraines/ approx 2 per week  . Hx of blood clots 12 yrs ago   in lungs, now on coumadin  . Hypertension    controlled on meds  . Hypothyroidism   . 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 Sickle cell trait ([place])   . Vertigo     There are no active problems to display for this patient.   Past Surgical History:  Procedure Laterality Date  . ARTERY BIOPSY Left 07/18/2016   Procedure: BIOPSY TEMPORAL ARTERY;  Surgeon: Clyde Canterbury, MD;  Location: [place];  Service: ENT;  Laterality: Left;  . CESAREAN SECTION     x1  . DIAGNOSTIC LAPAROSCOPY    . REPLACEMENT TOTAL KNEE Right     Prior to Admission medications   Medication Sig Start  Date End Date Taking? Authorizing Provider  acetaminophen (TYLENOL) 500 MG tablet Take 500 mg by mouth every 6 (six) hours as needed.    [provider]  carvedilol (COREG) 6.25 MG tablet Take 6.25 mg by mouth 2 (two) times daily with a meal.    [provider]  Cholecalciferol (VITAMIN D3 PO) Take 600 mg by mouth 2 (two) times daily.    [provider]  diclofenac sodium (VOLTAREN) 1 % GEL Apply topically 3 (three) times daily.    [provider]  gabapentin (NEURONTIN) 100 MG capsule Take 100 mg by mouth 2 (two) times daily. Am and pm    [provider]  hydrOXYzine (VISTARIL) 50 MG capsule Take 50 mg by mouth as needed.    [provider]  lamoTRIgine (LAMICTAL) 200 MG tablet Take 200 mg by mouth daily. Pm    [provider]  letrozole (FEMARA) 2.5 MG tablet Take 2.5 mg by mouth daily. pm    [provider]  levothyroxine (SYNTHROID, LEVOTHROID) 125 MCG tablet Take 125 mcg by mouth daily before breakfast. Am    [provider]  potassium chloride (KLOR-CON) 20 MEQ packet Take by mouth 2 (two) times daily. Am and pm    [provider]  sertraline (ZOLOFT) 100 MG tablet Take 100 mg by mouth daily. am    [provider]  torsemide (DEMADEX) 20 MG  tablet Take 20 mg by mouth daily. am    [provider]  vitamin B-12 (CYANOCOBALAMIN) 1000 MCG tablet Take 1,000 mcg by mouth daily. am    [provider]  warfarin (COUMADIN) 6 MG tablet Take 6 mg by mouth daily. Pm    [provider]    Allergies  Allergen Reactions  . Asa [Aspirin]     Sickle cell trait/ told not to take  . Celebrex [Celecoxib] Swelling  . Codeine Nausea And Vomiting    No family history on file.  Social History Social History  Substance Use Topics  . Smoking status: Never Smoker  . Smokeless tobacco: Never Used  . Alcohol use No    Review of Systems Constitutional: Negative for fever.positive  for dizziness. Cardiovascular: Negative for chest pain. Respiratory: Negative for shortness of breath. Gastrointestinal: Negative for abdominal pain. Positive nausea. Negative for diarrhea Genitourinary: Negative for dysuria. Neurological: Negative for headaches, focal weakness or numbness. All other ROS negative  ____________________________________________   PHYSICAL EXAM:  VITAL SIGNS: ED Triage Vitals  Enc Vitals Group     BP --      Pulse Rate 02/02/17 1024 69     Resp 02/02/17 1024 20     Temp 02/02/17 1024 98 F (36.7 C)     Temp Source 02/02/17 1024 Oral     SpO2 02/02/17 1024 100 %     Weight 02/02/17 1025 210 lb (95.3 kg)     Height --      Head Circumference --      Peak Flow --      Pain Score --      Pain Loc --      Pain Edu? --      Excl. in [place]? --     Constitutional: Alert and oriented. Well appearing and in no distress. Eyes: Normal exam ENT   Head: Normocephalic and atraumatic.   Mouth/Throat: Mucous membranes are moist. Cardiovascular: Normal rate, regular rhythm. No murmur Respiratory: Normal respiratory effort without tachypnea nor retractions. Breath sounds are clear  Gastrointestinal: Soft and nontender. No distention. Musculoskeletal: Nontender with normal range of motion in all extremities. Neurologic:  Normal speech and language. No gross focal neurologic deficits. No pronator drift. Equal grip strength. 5/5 motor in all extremities. Cranial nerves intact. Skin:  Skin is warm, dry and intact.  Psychiatric: Mood and affect are normal.   ____________________________________________    EKG  EKG reviewed and interpreted by myself shows normal sinus rhythm at 74 bpm, narrow QRS, normal axis, normal intervals, no concerning ST changes.  ____________________________________________    RADIOLOGY  CT head negative  ____________________________________________   INITIAL IMPRESSION / ASSESSMENT AND PLAN / ED COURSE  Pertinent labs  & imaging results that were available during my care of the patient were reviewed by me and considered in my medical decision making (see chart for details).  patient presents to the emergency department with dizziness/nausea. CT head is negative. Labs are largely within normal limits, urinalysis and troponin pending. Currently the patient states mild dizziness but overall feels well. Normal physical exam including normal neurological exam. We will treat with ODT Zofran, patient does not want an IV at this time.  patient states she is feeling much better after meclizine. She states a history of vertigo towards this feels somewhat similar. No neurological deficits. Patient is asking to be discharged home. I believe the patient is safe for discharge home with PCP follow-up on Monday. We'll discharge with  meclizine and Zofran. I discussed very strict return precautions for the patient.  ____________________________________________   FINAL CLINICAL IMPRESSION(S) / ED DIAGNOSES  dizziness Near-syncope    Harvest Dark, MD 02/02/17 1351

## 2017-02-02 NOTE — ED Triage Notes (Signed)
Pt brought over from [facility] for further eval of having a near syncopal episode in the bathroom. Pt was there for a well visit. Pt states she feels nauseas in triage.

## 2017-10-03 ENCOUNTER — Other Ambulatory Visit: Payer: Self-pay | Admitting: Podiatry

## 2017-10-10 ENCOUNTER — Ambulatory Visit: Payer: Medicare Other | Admitting: Anesthesiology

## 2017-10-10 ENCOUNTER — Ambulatory Visit
Admission: RE | Admit: 2017-10-10 | Discharge: 2017-10-10 | Disposition: A | Discharge status: Home / Self Care | Payer: Medicare Other | Source: Ambulatory Visit | Attending: Podiatry | Admitting: Podiatry

## 2017-10-10 ENCOUNTER — Encounter
Admission: RE | Disposition: A | Discharge status: Home / Self Care | Payer: Self-pay | Source: Ambulatory Visit | Attending: Podiatry

## 2017-10-10 DIAGNOSIS — M2041 Other hammer toe(s) (acquired), right foot: Secondary | ICD-10-CM | POA: Diagnosis not present

## 2017-10-10 DIAGNOSIS — Z86711 Personal history of pulmonary embolism: Secondary | ICD-10-CM | POA: Diagnosis not present

## 2017-10-10 DIAGNOSIS — Z853 Personal history of malignant neoplasm of breast: Secondary | ICD-10-CM | POA: Diagnosis not present

## 2017-10-10 DIAGNOSIS — I10 Essential (primary) hypertension: Secondary | ICD-10-CM | POA: Diagnosis not present

## 2017-10-10 HISTORY — PX: HAMMER TOE SURGERY: SHX385

## 2017-10-10 SURGERY — CORRECTION, HAMMER TOE
Anesthesia: General | Site: Foot | Laterality: Right | Wound class: Clean

## 2017-10-10 MED ORDER — LIDOCAINE HCL (CARDIAC) PF 100 MG/5ML IV SOSY
PREFILLED_SYRINGE | INTRAVENOUS | Status: DC | PRN
  Administered 2017-10-10: 40 mg via INTRAVENOUS

## 2017-10-10 MED ORDER — CHLORHEXIDINE GLUCONATE 4 % EX LIQD: 60.0000 mL | Freq: Once | CUTANEOUS | Status: DC

## 2017-10-10 MED ORDER — POVIDONE-IODINE 7.5 % EX SOLN: Freq: Once | CUTANEOUS | Status: DC

## 2017-10-10 MED ORDER — LIDOCAINE-EPINEPHRINE 1 %-1:100000 IJ SOLN
INTRAMUSCULAR | Status: DC | PRN
  Administered 2017-10-10: 3 mL

## 2017-10-10 MED ORDER — PROPOFOL 500 MG/50ML IV EMUL
INTRAVENOUS | Status: DC | PRN
  Administered 2017-10-10: 75 ug/kg/min via INTRAVENOUS

## 2017-10-10 MED ORDER — OXYCODONE HCL 5 MG/5ML PO SOLN: 5.0000 mg | Freq: Once | ORAL | Status: DC | PRN

## 2017-10-10 MED ORDER — FENTANYL CITRATE (PF) 100 MCG/2ML IJ SOLN: 25.0000 ug | INTRAMUSCULAR | Status: DC | PRN

## 2017-10-10 MED ORDER — ONDANSETRON HCL 4 MG PO TABS: 4.0000 mg | ORAL_TABLET | Freq: Four times a day (QID) | ORAL | Status: DC | PRN

## 2017-10-10 MED ORDER — LACTATED RINGERS IV SOLN: INTRAVENOUS | Status: DC

## 2017-10-10 MED ORDER — ENOXAPARIN SODIUM 40 MG/0.4ML ~~LOC~~ SOLN
40.0000 mg | SUBCUTANEOUS | Status: DC
  Administered 2017-10-10: 40 mg via SUBCUTANEOUS

## 2017-10-10 MED ORDER — LIDOCAINE HCL (PF) 1 % IJ SOLN
INTRAMUSCULAR | Status: DC | PRN
  Administered 2017-10-10: 5 mL

## 2017-10-10 MED ORDER — ONDANSETRON HCL 4 MG/2ML IJ SOLN
INTRAMUSCULAR | Status: DC | PRN
  Administered 2017-10-10: 4 mg via INTRAVENOUS

## 2017-10-10 MED ORDER — ACETAMINOPHEN 10 MG/ML IV SOLN: 1000.0000 mg | Freq: Once | INTRAVENOUS | Status: DC | PRN

## 2017-10-10 MED ORDER — ONDANSETRON HCL 4 MG/2ML IJ SOLN: 4.0000 mg | Freq: Four times a day (QID) | INTRAMUSCULAR | Status: DC | PRN

## 2017-10-10 MED ORDER — CEFAZOLIN SODIUM-DEXTROSE 2-4 GM/100ML-% IV SOLN
2.0000 g | INTRAVENOUS | Status: AC
  Administered 2017-10-10: 2 g via INTRAVENOUS

## 2017-10-10 MED ORDER — MIDAZOLAM HCL 2 MG/2ML IJ SOLN
INTRAMUSCULAR | Status: DC | PRN
  Administered 2017-10-10: 2 mg via INTRAVENOUS

## 2017-10-10 MED ORDER — OXYCODONE-ACETAMINOPHEN 5-325 MG PO TABS: 1.0000 | ORAL_TABLET | Freq: Four times a day (QID) | ORAL | 0 refills | Status: DC | PRN

## 2017-10-10 MED ORDER — BUPIVACAINE HCL (PF) 0.25 % IJ SOLN
INTRAMUSCULAR | Status: DC | PRN
  Administered 2017-10-10: 5 mL

## 2017-10-10 MED ORDER — ONDANSETRON HCL 4 MG/2ML IJ SOLN: 4.0000 mg | Freq: Once | INTRAMUSCULAR | Status: DC | PRN

## 2017-10-10 MED ORDER — FENTANYL CITRATE (PF) 100 MCG/2ML IJ SOLN
INTRAMUSCULAR | Status: DC | PRN
  Administered 2017-10-10: 100 ug via INTRAVENOUS

## 2017-10-10 MED ORDER — OXYCODONE HCL 5 MG PO TABS: 5.0000 mg | ORAL_TABLET | Freq: Once | ORAL | Status: DC | PRN

## 2017-10-10 MED ORDER — LACTATED RINGERS IV SOLN
INTRAVENOUS | Status: DC
  Administered 2017-10-10: 13:00:00 via INTRAVENOUS

## 2017-10-10 SURGICAL SUPPLY — 49 items
BANDAGE ELASTIC 4 VELCRO NS (GAUZE/BANDAGES/DRESSINGS) ×2 IMPLANT
BENZOIN TINCTURE PRP APPL 2/3 (GAUZE/BANDAGES/DRESSINGS) ×2 IMPLANT
BLADE MED AGGRESSIVE (BLADE) IMPLANT
BLADE MINI RND TIP GREEN BEAV (BLADE) ×2 IMPLANT
BLADE OSC/SAGITTAL MD 5.5X18 (BLADE) ×2 IMPLANT
BLADE SURG 15 STRL LF DISP TIS (BLADE) IMPLANT
BLADE SURG 15 STRL SS (BLADE)
BNDG COHESIVE 4X5 TAN STRL (GAUZE/BANDAGES/DRESSINGS) ×2 IMPLANT
BNDG ESMARK 4X12 TAN STRL LF (GAUZE/BANDAGES/DRESSINGS) ×2 IMPLANT
BNDG GAUZE 4.5X4.1 6PLY STRL (MISCELLANEOUS) ×2 IMPLANT
BNDG STRETCH 4X75 STRL LF (GAUZE/BANDAGES/DRESSINGS) ×2 IMPLANT
CANISTER SUCT 1200ML W/VALVE (MISCELLANEOUS) ×2 IMPLANT
COVER LIGHT HANDLE UNIVERSAL (MISCELLANEOUS) ×4 IMPLANT
CUFF TOURN SGL QUICK 18 (TOURNIQUET CUFF) ×2 IMPLANT
DRAPE FLUOR MINI C-ARM 54X84 (DRAPES) ×2 IMPLANT
DURAPREP 26ML APPLICATOR (WOUND CARE) ×2 IMPLANT
ELECT REM PT RETURN 9FT ADLT (ELECTROSURGICAL) ×2
ELECTRODE REM PT RTRN 9FT ADLT (ELECTROSURGICAL) ×1 IMPLANT
GAUZE PETRO XEROFOAM 1X8 (MISCELLANEOUS) ×2 IMPLANT
GAUZE SPONGE 4X4 12PLY STRL (GAUZE/BANDAGES/DRESSINGS) ×2 IMPLANT
GLOVE BIO SURGEON STRL SZ7.5 (GLOVE) ×4 IMPLANT
GLOVE INDICATOR 8.0 STRL GRN (GLOVE) ×4 IMPLANT
GOWN STRL REUS W/ TWL LRG LVL3 (GOWN DISPOSABLE) ×2 IMPLANT
GOWN STRL REUS W/TWL LRG LVL3 (GOWN DISPOSABLE) ×2
K-WIRE DBL END TROCAR 6X.045 (WIRE)
K-WIRE DBL END TROCAR 6X.062 (WIRE)
K-WIRE DBL TROCAR .045X4 ×6 IMPLANT
KIT TURNOVER KIT A (KITS) ×2 IMPLANT
KWIRE DBL END TROCAR 6X.045 (WIRE) IMPLANT
KWIRE DBL END TROCAR 6X.062 (WIRE) IMPLANT
KWIRE DBL TROCAR .045X4 ×3 IMPLANT
NS IRRIG 500ML POUR BTL (IV SOLUTION) ×2 IMPLANT
PACK EXTREMITY ARMC (MISCELLANEOUS) ×2 IMPLANT
PIN BALLS 3/8 F/.045 WIRE (MISCELLANEOUS) ×6 IMPLANT
RASP SM TEAR CROSS CUT (RASP) IMPLANT
STOCKINETTE IMPERVIOUS LG (DRAPES) ×2 IMPLANT
STRAP BODY AND KNEE 60X3 (MISCELLANEOUS) ×2 IMPLANT
STRIP CLOSURE SKIN 1/4X4 (GAUZE/BANDAGES/DRESSINGS) ×2 IMPLANT
SUT ETHILON 4-0 (SUTURE) ×1
SUT ETHILON 4-0 FS2 18XMFL BLK (SUTURE) ×1
SUT ETHILON 5-0 FS-2 18 BLK (SUTURE) IMPLANT
SUT MNCRL 5-0+ PC-1 (SUTURE) IMPLANT
SUT MONOCRYL 5-0 (SUTURE)
SUT VIC AB 2-0 SH 27 (SUTURE)
SUT VIC AB 2-0 SH 27XBRD (SUTURE) IMPLANT
SUT VIC AB 3-0 SH 27 (SUTURE)
SUT VIC AB 3-0 SH 27X BRD (SUTURE) IMPLANT
SUT VIC AB 4-0 FS2 27 (SUTURE) ×2 IMPLANT
SUTURE ETHLN 4-0 FS2 18XMF BLK (SUTURE) ×1 IMPLANT

## 2017-10-10 NOTE — H&P (Signed)
  HISTORY AND PHYSICAL INTERVAL NOTE:  10/10/2017  2:19 PM  Alexis Atkins  has presented today for surgery, with the diagnosis of Hammertoe-Right.  The various methods of treatment have been discussed with the patient.  No guarantees were given.  After consideration of risks, benefits and other options for treatment, the patient has consented to surgery.  I have reviewed the patients' chart and labs.    Patient Vitals for the past 24 hrs:  BP Temp Temp src Pulse SpO2 Weight  10/10/17 1313 (!) 136/91 (!) 97.5 F (36.4 C) Temporal 87 100 % 89.8 kg (198 lb)    A history and physical examination was performed in my office.  The patient was reexamined.  There have been no changes to this history and physical examination.  Samara Deist A

## 2017-10-10 NOTE — Anesthesia Procedure Notes (Signed)
Performed by: Coralynn Gaona, CRNA Pre-anesthesia Checklist: Patient identified, Emergency Drugs available, Suction available, Timeout performed and Patient being monitored Patient Re-evaluated:Patient Re-evaluated prior to induction Oxygen Delivery Method: Simple face mask Placement Confirmation: positive ETCO2       

## 2017-10-10 NOTE — Anesthesia Preprocedure Evaluation (Signed)
Anesthesia Evaluation  Patient identified by MRN, date of birth, ID band Patient awake    Reviewed: Allergy & Precautions, NPO status , Patient's Chart, lab work & pertinent test results  History of Anesthesia Complications Negative for: history of anesthetic complications  Airway Mallampati: IV  TM Distance: >3 FB Neck ROM: Full  Mouth opening: Limited Mouth Opening  Dental  (+)    Pulmonary neg pulmonary ROS,    Pulmonary exam normal breath sounds clear to auscultation       Cardiovascular Exercise Tolerance: Good hypertension, Normal cardiovascular exam Rhythm:Regular Rate:Normal     Neuro/Psych  Headaches, PSYCHIATRIC DISORDERS Depression Vertigo   Neuromuscular disease (neuropathy hands/feet)    GI/Hepatic negative GI ROS,   Endo/Other  Hypothyroidism   Renal/GU negative Renal ROS     Musculoskeletal   Abdominal   Peds  Hematology Hx PE 2001 on warfarin at home (currently bridging with Lovenox); hx breast CA; Sickle Cell trait   Anesthesia Other Findings   Reproductive/Obstetrics                             Anesthesia Physical Anesthesia Plan  ASA: III  Anesthesia Plan: General   Post-op Pain Management:    Induction: Intravenous  PONV Risk Score and Plan: 2 and Propofol infusion and TIVA  Airway Management Planned: Natural Airway  Additional Equipment:   Intra-op Plan:   Post-operative Plan:   Informed Consent: I have reviewed the patients History and Physical, chart, labs and discussed the procedure including the risks, benefits and alternatives for the proposed anesthesia with the patient or authorized representative who has indicated his/her understanding and acceptance.     Plan Discussed with: CRNA  Anesthesia Plan Comments:         Anesthesia Quick Evaluation

## 2017-10-10 NOTE — Op Note (Signed)
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 that presents today for surgical intervention.  The risks/benefits/alternatives/complications have been discussed and consent has been given.    Procedure:  Patient was brought into the OR and placed on the operating table in thesupine position. After anesthesia was obtained theright lower extremity was prepped and draped in usual sterile fashion.  Attention was directed to the dorsal aspect of the second third and fourth toes where longitudinal incision was performed overlying the PIPJ.  Sharp and blunt dissection carried down to the long extensor tendon.  Tenotomy was performed.  The head of the proximal phalanx and base of the middle phalanx were exposed.  Second and third toes had fairly normal anatomy.  Fourth toe had market scarring within the PIPJ.  At this point the residual head of the proximal phalanx and base of the middle phalanx were removed of the articular cartilage.  Mobilization of the toes was then performed.  This was then realigned into a rectus position.  0.045 K wire was driven from the base of the middle phalanx to the tip of the toe and retrograded back across the PIPJ to the base of the proximal phalanx of all 3 toes.  Good alignment was noted in all planes.  Wounds were flushed with copious amounts of irrigation.  Closure was performed with a 4-0 Vicryl for the extensor tendon.  4-0 nylon for the skin.  A 0.25% bupivacaine mixed with 1% lidocaine local block was placed around the toes prior to the  procedure starting.  A bulky sterile dressing was applied.    Patient tolerated the procedure and anesthesia well.  Was transported from the OR to the PACU with all vital signs stable and vascular status intact. To be discharged per routine protocol.  Will follow up in approximately 1 week in the outpatient clinic.

## 2017-10-10 NOTE — Transfer of Care (Signed)
Immediate Anesthesia Transfer of Care Note  Patient: Alexis Atkins  Procedure(s) Performed: HAMMER TOE CORRECTION-2ND AND 3RD TOE and 4TH (Right Foot)  Patient Location: PACU  Anesthesia Type: General  Level of Consciousness: awake, alert  and patient cooperative  Airway and Oxygen Therapy: Patient Spontanous Breathing and Patient connected to supplemental oxygen  Post-op Assessment: Post-op Vital signs reviewed, Patient's Cardiovascular Status Stable, Respiratory Function Stable, Patent Airway and No signs of Nausea or vomiting  Post-op Vital Signs: Reviewed and stable  Complications: No apparent anesthesia complications

## 2017-10-10 NOTE — Discharge Instructions (Signed)
[facility] °[facility] ° °POST OPERATIVE INSTRUCTIONS FOR DR. TROXLER AND DR. FOWLER °[facility] ° ° °1. Take your medication as prescribed.  Pain medication should be taken only as needed. ° °2. Keep the dressing clean, dry and intact. ° °3. Keep your foot elevated above the heart level for the first 48 hours. ° °4. Walking to the bathroom and brief periods of walking are acceptable, unless we have instructed you to be non-weight bearing. ° °5. Always wear your post-op shoe when walking.  Always use your crutches if you are to be non-weight bearing. ° °6. Do not take a shower. Baths are permissible as long as the foot is kept out of the water.  ° °7. Every hour you are awake:  °- Bend your knee 15 times. °- Flex foot 15 times °- Massage calf 15 times ° °8. Call [facility] ([phone]) if any of the following problems occur: °- You develop a temperature or fever. °- The bandage becomes saturated with blood. °- Medication does not stop your pain. °- Injury of the foot occurs. °- Any symptoms of infection including redness, odor, or red streaks running from wound. ° ° °General Anesthesia, Adult, Care After °These instructions provide you with information about caring for yourself after your procedure. Your health care provider may also give you more specific instructions. Your treatment has been planned according to current medical practices, but problems sometimes occur. Call your health care provider if you have any problems or questions after your procedure. °What can I expect after the procedure? °After the procedure, it is common to have: °· Vomiting. °· A sore throat. °· Mental slowness. ° °It is common to feel: °· Nauseous. °· Cold or shivery. °· Sleepy. °· Tired. °· Sore or achy, even in parts of your body where you did not have surgery. ° °Follow these instructions at home: °For at least 24 hours after the procedure: °· Do not: °? Participate in  activities where you could fall or become injured. °? Drive. °? Use heavy machinery. °? Drink alcohol. °? Take sleeping pills or medicines that cause drowsiness. °? Make important decisions or sign legal documents. °? Take care of children on your own. °· Rest. °Eating and drinking °· If you vomit, drink water, juice, or soup when you can drink without vomiting. °· Drink enough fluid to keep your urine clear or pale yellow. °· Make sure you have little or no nausea before eating solid foods. °· Follow the diet recommended by your health care provider. °General instructions °· Have a responsible adult stay with you until you are awake and alert. °· Return to your normal activities as told by your health care provider. Ask your health care provider what activities are safe for you. °· Take over-the-counter and prescription medicines only as told by your health care provider. °· If you smoke, do not smoke without supervision. °· Keep all follow-up visits as told by your health care provider. This is important. °Contact a health care provider if: °· You continue to have nausea or vomiting at home, and medicines are not helpful. °· You cannot drink fluids or start eating again. °· You cannot urinate after 8-12 hours. °· You develop a skin rash. °· You have fever. °· You have increasing redness at the site of your procedure. °Get help right away if: °· You have difficulty breathing. °· You have chest pain. °· You have unexpected bleeding. °· You feel that you   are having a life-threatening or urgent problem. °This information is not intended to replace advice given to you by your health care provider. Make sure you discuss any questions you have with your health care provider. °Document Released: 08/21/2000 Document Revised: 10/18/2015 Document Reviewed: 04/29/2015 °Elsevier Interactive Patient Education © 2018 [employer] ° °

## 2017-10-10 NOTE — Anesthesia Postprocedure Evaluation (Signed)
Anesthesia Post Note  Patient: Alexis Atkins  Procedure(s) Performed: HAMMER TOE CORRECTION-2ND AND 3RD TOE and 4TH (Right Foot)  Patient location during evaluation: PACU Anesthesia Type: General Level of consciousness: awake and alert, oriented and patient cooperative Pain management: pain level controlled Vital Signs Assessment: post-procedure vital signs reviewed and stable Respiratory status: spontaneous breathing, nonlabored ventilation and respiratory function stable Cardiovascular status: blood pressure returned to baseline and stable Postop Assessment: adequate PO intake Anesthetic complications: no    Darrin Nipper

## 2017-10-11 ENCOUNTER — Encounter: Payer: Self-pay | Admitting: Podiatry

## 2017-11-26 ENCOUNTER — Ambulatory Visit: Payer: Medicare Other | Admitting: Anesthesiology

## 2017-11-26 ENCOUNTER — Other Ambulatory Visit: Payer: Self-pay

## 2017-11-26 ENCOUNTER — Ambulatory Visit
Admission: RE | Admit: 2017-11-26 | Discharge: 2017-11-26 | Disposition: A | Discharge status: Home / Self Care | Payer: Medicare Other | Source: Ambulatory Visit | Attending: Unknown Physician Specialty | Admitting: Unknown Physician Specialty

## 2017-11-26 ENCOUNTER — Encounter: Payer: Self-pay | Admitting: Anesthesiology

## 2017-11-26 ENCOUNTER — Encounter
Admission: RE | Disposition: A | Discharge status: Home / Self Care | Payer: Self-pay | Source: Ambulatory Visit | Attending: Unknown Physician Specialty

## 2017-11-26 DIAGNOSIS — Z86711 Personal history of pulmonary embolism: Secondary | ICD-10-CM | POA: Insufficient documentation

## 2017-11-26 DIAGNOSIS — Z853 Personal history of malignant neoplasm of breast: Secondary | ICD-10-CM | POA: Diagnosis not present

## 2017-11-26 DIAGNOSIS — K621 Rectal polyp: Secondary | ICD-10-CM | POA: Diagnosis not present

## 2017-11-26 DIAGNOSIS — K219 Gastro-esophageal reflux disease without esophagitis: Secondary | ICD-10-CM | POA: Diagnosis not present

## 2017-11-26 DIAGNOSIS — I10 Essential (primary) hypertension: Secondary | ICD-10-CM | POA: Diagnosis not present

## 2017-11-26 DIAGNOSIS — Z1211 Encounter for screening for malignant neoplasm of colon: Secondary | ICD-10-CM | POA: Diagnosis not present

## 2017-11-26 DIAGNOSIS — K5730 Diverticulosis of large intestine without perforation or abscess without bleeding: Secondary | ICD-10-CM | POA: Diagnosis not present

## 2017-11-26 DIAGNOSIS — F329 Major depressive disorder, single episode, unspecified: Secondary | ICD-10-CM | POA: Insufficient documentation

## 2017-11-26 DIAGNOSIS — Z79899 Other long term (current) drug therapy: Secondary | ICD-10-CM | POA: Insufficient documentation

## 2017-11-26 DIAGNOSIS — E039 Hypothyroidism, unspecified: Secondary | ICD-10-CM | POA: Insufficient documentation

## 2017-11-26 DIAGNOSIS — D573 Sickle-cell trait: Secondary | ICD-10-CM | POA: Diagnosis not present

## 2017-11-26 DIAGNOSIS — Z7901 Long term (current) use of anticoagulants: Secondary | ICD-10-CM | POA: Insufficient documentation

## 2017-11-26 HISTORY — DX: Depression, unspecified: F32.A

## 2017-11-26 HISTORY — PX: COLONOSCOPY WITH PROPOFOL: SHX5780

## 2017-11-26 HISTORY — DX: Anxiety disorder, unspecified: F41.9

## 2017-11-26 HISTORY — DX: Unspecified osteoarthritis, unspecified site: M19.90

## 2017-11-26 HISTORY — DX: Polyneuropathy, unspecified: G62.9

## 2017-11-26 HISTORY — DX: Major depressive disorder, single episode, unspecified: F32.9

## 2017-11-26 HISTORY — DX: Other pulmonary embolism without acute cor pulmonale: I26.99

## 2017-11-26 HISTORY — DX: Gastro-esophageal reflux disease without esophagitis: K21.9

## 2017-11-26 SURGERY — COLONOSCOPY WITH PROPOFOL
Anesthesia: General

## 2017-11-26 MED ORDER — PROPOFOL 10 MG/ML IV BOLUS
INTRAVENOUS | Status: DC | PRN
  Administered 2017-11-26: 30 mg via INTRAVENOUS
  Administered 2017-11-26: 40 mg via INTRAVENOUS

## 2017-11-26 MED ORDER — PROPOFOL 500 MG/50ML IV EMUL
INTRAVENOUS | Status: AC
  Filled 2017-11-26: qty 50

## 2017-11-26 MED ORDER — LIDOCAINE HCL (PF) 2 % IJ SOLN
INTRAMUSCULAR | Status: AC
  Filled 2017-11-26: qty 10

## 2017-11-26 MED ORDER — FENTANYL CITRATE (PF) 100 MCG/2ML IJ SOLN
INTRAMUSCULAR | Status: DC | PRN
  Administered 2017-11-26: 50 ug via INTRAVENOUS

## 2017-11-26 MED ORDER — SODIUM CHLORIDE 0.9 % IV SOLN
INTRAVENOUS | Status: DC
  Administered 2017-11-26 (×2): via INTRAVENOUS

## 2017-11-26 MED ORDER — PROPOFOL 500 MG/50ML IV EMUL
INTRAVENOUS | Status: DC | PRN
  Administered 2017-11-26: 180 ug/kg/min via INTRAVENOUS

## 2017-11-26 MED ORDER — PIPERACILLIN-TAZOBACTAM 3.375 G IVPB 30 MIN
3.3750 g | Freq: Once | INTRAVENOUS | Status: DC
  Filled 2017-11-26: qty 50

## 2017-11-26 MED ORDER — PIPERACILLIN-TAZOBACTAM 3.375 G IVPB
INTRAVENOUS | Status: AC
  Filled 2017-11-26: qty 50

## 2017-11-26 MED ORDER — FENTANYL CITRATE (PF) 100 MCG/2ML IJ SOLN
INTRAMUSCULAR | Status: AC
  Filled 2017-11-26: qty 2

## 2017-11-26 MED ORDER — SODIUM CHLORIDE 0.9 % IV SOLN: INTRAVENOUS | Status: DC

## 2017-11-26 NOTE — Anesthesia Procedure Notes (Signed)
Date/Time: 11/26/2017 1:00 PM Performed by: Allean Found, CRNA Pre-anesthesia Checklist: Patient identified, Emergency Drugs available, Suction available, Patient being monitored and Timeout performed Patient Re-evaluated:Patient Re-evaluated prior to induction Induction Type: IV induction Placement Confirmation: positive ETCO2

## 2017-11-26 NOTE — Anesthesia Post-op Follow-up Note (Signed)
Anesthesia QCDR form completed.        

## 2017-11-26 NOTE — Op Note (Signed)
[facility] Patient Name: Alexis Atkins Procedure Date: 11/26/2017 1:02 PM MRN: [id number] Account #: [id number] Date of Birth: [birth year] Admit Type: Outpatient Age: 67 Room: [facility] ENDO ROOM 1 Gender: Female Note Status: Finalized Procedure:            Colonoscopy Indications:          Screening for colorectal malignant neoplasm Providers:            Manya Silvas, MD Referring MD:         Ocie Cornfield. Ouida Sills MD, MD (Referring MD) Medicines:            Propofol per Anesthesia Complications:        No immediate complications. Procedure:            Pre-Anesthesia Assessment:                       - After reviewing the risks and benefits, the patient                        was deemed in satisfactory condition to undergo the                        procedure.                       After obtaining informed consent, the colonoscope was                        passed under direct vision. Throughout the procedure,                        the patient's blood pressure, pulse, and oxygen                        saturations were monitored continuously. The                        Colonoscope was introduced through the anus and                        advanced to the the cecum, identified by appendiceal                        orifice and ileocecal valve. The colonoscopy was                        performed without difficulty. The patient tolerated the                        procedure well. The quality of the bowel preparation                        was excellent. Findings:      A diminutive polyp was found in the rectum. The polyp was sessile. The       polyp was removed with a cold snare. Resection and retrieval were       complete.      A diminutive polyp was found in the rectum. The polyp was sessile. The       polyp was removed with a jumbo cold forceps. Resection and retrieval  were complete.      A diminutive polyp was found in the rectum. The polyp  was sessile. The       polyp was removed with a jumbo cold forceps. Resection and retrieval       were complete.      A few small-mouthed diverticula were found in the ascending colon.      The exam was otherwise without abnormality. Impression:           - One diminutive polyp in the rectum, removed with a                        cold snare. Resected and retrieved.                       - One diminutive polyp in the rectum, removed with a                        jumbo cold forceps. Resected and retrieved.                       - One diminutive polyp in the rectum, removed with a                        jumbo cold forceps. Resected and retrieved.                       - Diverticulosis in the ascending colon.                       - The examination was otherwise normal. Recommendation:       - Await pathology results. Manya Silvas, MD 11/26/2017 1:38:39 PM This report has been signed electronically. Number of Addenda: 0 Note Initiated On: 11/26/2017 1:02 PM Scope Withdrawal Time: 0 hours 13 minutes 59 seconds  Total Procedure Duration: 0 hours 18 minutes 7 seconds       [facility]

## 2017-11-26 NOTE — Anesthesia Preprocedure Evaluation (Signed)
Anesthesia Evaluation  Patient identified by MRN, date of birth, ID band Patient awake    Reviewed: Allergy & Precautions, NPO status , Patient's Chart, lab work & pertinent test results, reviewed documented beta blocker date and time   Airway Mallampati: III  TM Distance: >3 FB     Dental  (+) Chipped   Pulmonary           Cardiovascular hypertension, Pt. on medications and Pt. on home beta blockers      Neuro/Psych  Headaches, PSYCHIATRIC DISORDERS Anxiety  Neuromuscular disease    GI/Hepatic GERD  ,  Endo/Other  Hypothyroidism   Renal/GU      Musculoskeletal  (+) Arthritis ,   Abdominal   Peds  Hematology   Anesthesia Other Findings Hx of PE. Sickle cell trait.  Reproductive/Obstetrics                             Anesthesia Physical Anesthesia Plan  ASA: III  Anesthesia Plan: General   Post-op Pain Management:    Induction: Intravenous  PONV Risk Score and Plan:   Airway Management Planned: Oral ETT  Additional Equipment:   Intra-op Plan:   Post-operative Plan:   Informed Consent: I have reviewed the patients History and Physical, chart, labs and discussed the procedure including the risks, benefits and alternatives for the proposed anesthesia with the patient or authorized representative who has indicated his/her understanding and acceptance.     Plan Discussed with: CRNA  Anesthesia Plan Comments:         Anesthesia Quick Evaluation

## 2017-11-26 NOTE — H&P (Signed)
Primary Care Physician:  Kirk Ruths, MD Primary Gastroenterologist:  Dr. Vira Agar  Pre-Procedure History & Physical: HPI:  Alexis Atkins is a 67 y.o. female is here for an colonoscopy.  Done for screening colonoscopy.   Past Medical History:  Diagnosis Date  . Anxiety   . Arthritis   . Cancer [facility]) 2014   breast right side  . Depression   . GERD (gastroesophageal reflux disease)   . Headache    migraines/ approx 2 per week  . Hx of blood clots 12 yrs ago   in lungs, now on coumadin  . Hypertension    controlled on meds  . Hypothyroidism   . 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 Neuropathy    fingers, toes and balls of feet  . Pulmonary embolism ([place])   . Sickle cell trait ([place])   . Vertigo     Past Surgical History:  Procedure Laterality Date  . ARTERY BIOPSY Left 07/18/2016   Procedure: BIOPSY TEMPORAL ARTERY;  Surgeon: Clyde Canterbury, MD;  Location: [place];  Service: ENT;  Laterality: Left;  . BREAST SURGERY Right 2014  . CESAREAN SECTION     x1  . COLONOSCOPY  05/28/2003, 09/12/2007  . DIAGNOSTIC LAPAROSCOPY    . DIAGNOSTIC LAPAROSCOPY    . DILATION AND CURETTAGE OF UTERUS    . ESOPHAGOGASTRODUODENOSCOPY  05/30/2001  . FOOT SURGERY Bilateral 1984 & 1985  . HAMMER TOE SURGERY Right 10/10/2017   Procedure: HAMMER TOE CORRECTION-2ND AND 3RD TOE and 4TH;  Surgeon: Samara Deist, DPM;  Location: [facility];  Service: Podiatry;  Laterality: Right;  IVA LOCAL HAMMERTOE  . KNEE ARTHROSCOPY Right 07/13/06, 05/11/14  . REPLACEMENT TOTAL KNEE Right   . TONSILLECTOMY      Prior to Admission medications   Medication Sig Start Date End Date Taking? Authorizing Provider  acetaminophen (TYLENOL) 500 MG tablet Take 500 mg by mouth every 6 (six) hours as needed.   Yes [provider]  carvedilol (COREG) 6.25 MG tablet Take 6.25 mg by mouth 2 (two) times daily with a meal.   Yes  [provider]  Cholecalciferol (VITAMIN D3 PO) Take 600 mg by mouth 2 (two) times daily.   Yes [provider]  Cyanocobalamin (VITAMIN B-12) 500 MCG LOZG Take 500 mcg by mouth daily. am    Yes [provider]  DULoxetine (CYMBALTA) 20 MG capsule Take 20 mg by mouth daily.   Yes [provider]  enoxaparin (LOVENOX) 40 MG/0.4ML injection Inject 40 mg into the skin daily.   Yes [provider]  LamoTRIgine 100 MG TBDP Take 100 mg by mouth daily. Pm    Yes [provider]  letrozole (FEMARA) 2.5 MG tablet Take 2.5 mg by mouth daily. pm   Yes [provider]  levothyroxine (SYNTHROID, LEVOTHROID) 175 MCG tablet Take 175 mcg by mouth daily before breakfast. Am    Yes [provider]  magnesium oxide (MAG-OX) 400 MG tablet Take 800 mg by mouth daily.   Yes [provider]  meclizine (ANTIVERT) 25 MG tablet Take 1 tablet (25 mg total) by mouth 3 (three) times daily as needed for dizziness. 02/02/17  Yes Harvest Dark, MD  Multiple Vitamin (MULTIVITAMIN) tablet Take 1 tablet by mouth daily.   Yes [provider]  ondansetron (ZOFRAN ODT) 4 MG disintegrating tablet Take 1 tablet (4 mg total) by mouth every 8 (eight)  hours as needed for nausea or vomiting. 02/02/17  Yes Paduchowski, Lennette Bihari, MD  potassium chloride (KLOR-CON) 20 MEQ packet Take by mouth 2 (two) times daily. Am and pm   Yes [provider]  torsemide (DEMADEX) 20 MG tablet Take 20 mg by mouth daily. am   Yes [provider]  warfarin (COUMADIN) 6 MG tablet Take 6 mg by mouth daily. Pm   Yes [provider]  gabapentin (NEURONTIN) 100 MG capsule Take 100 mg by mouth 3 (three) times daily. Am and pm     [provider]  oxyCODONE-acetaminophen (PERCOCET) 5-325 MG tablet Take 1-2 tablets by mouth every 6 (six) hours as needed for severe pain. Patient not taking: Reported on 11/26/2017 10/10/17   Samara Deist, DPM   sertraline (ZOLOFT) 100 MG tablet Take 100 mg by mouth daily. am    [provider]    Allergies as of 10/11/2017 - Review Complete 10/10/2017  Allergen Reaction Noted  . Asa [aspirin]  07/11/2016  . Celebrex [celecoxib] Swelling 07/11/2016  . Codeine Nausea And Vomiting 07/11/2016    History reviewed. No pertinent family history.  Social History   Socioeconomic History  . Marital status: Married    Spouse name: Not on file  . Number of children: Not on file  . Years of education: Not on file  . Highest education level: Not on file  Occupational History  . Not on file  Social Needs  . Financial resource strain: Not on file  . Food insecurity:    Worry: Not on file    Inability: Not on file  . Transportation needs:    Medical: Not on file    Non-medical: Not on file  Tobacco Use  . Smoking status: Never Smoker  . Smokeless tobacco: Never Used  Substance and Sexual Activity  . Alcohol use: No  . Drug use: Never  . Sexual activity: Not on file  Lifestyle  . Physical activity:    Days per week: Not on file    Minutes per session: Not on file  . Stress: Not on file  Relationships  . Social connections:    Talks on phone: Not on file    Gets together: Not on file    Attends religious service: Not on file    Active member of club or organization: Not on file    Attends meetings of clubs or organizations: Not on file    Relationship status: Not on file  . Intimate partner violence:    Fear of current or ex partner: Not on file    Emotionally abused: Not on file    Physically abused: Not on file    Forced sexual activity: Not on file  Other Topics Concern  . Not on file  Social History Narrative  . Not on file    Review of Systems: See HPI, otherwise negative ROS  Physical Exam: BP (!) 165/100   Pulse 91   Temp (!) 97.3 F (36.3 C) (Tympanic)   Resp 18   Ht 5\' 7"  (1.702 m)   Wt 90.3 kg (199 lb)   SpO2 100%   BMI 31.17 kg/m  General:    Alert,  pleasant and cooperative in NAD Head:  Normocephalic and atraumatic. Neck:  Supple; no masses or thyromegaly. Lungs:  Clear throughout to auscultation.    Heart:  Regular rate and rhythm. Abdomen:  Soft, nontender and nondistended. Normal bowel sounds, without guarding, and without rebound.   Neurologic:  Alert and  oriented x4;  grossly normal neurologically.  Impression/Plan: Alexis Atkins is here for an colonoscopy to be performed for screening colonoscopy.  Risks, benefits, limitations, and alternatives regarding  colonoscopy have been reviewed with the patient.  Questions have been answered.  All parties agreeable.   Gaylyn Cheers, MD  11/26/2017, 12:42 PM

## 2017-11-26 NOTE — Transfer of Care (Signed)
Immediate Anesthesia Transfer of Care Note  Patient: Alexis Atkins  Procedure(s) Performed: COLONOSCOPY WITH PROPOFOL (N/A )  Patient Location: PACU  Anesthesia Type:General  Level of Consciousness: awake  Airway & Oxygen Therapy: Patient Spontanous Breathing and Patient connected to nasal cannula oxygen  Post-op Assessment: Report given to RN and Post -op Vital signs reviewed and stable  Post vital signs: Reviewed and stable  Last Vitals:  Vitals Value Taken Time  BP 124/62 11/26/2017  1:42 PM  Temp 36.2 C 11/26/2017  1:42 PM  Pulse 84 11/26/2017  1:43 PM  Resp 20 11/26/2017  1:43 PM  SpO2 99 % 11/26/2017  1:43 PM  Vitals shown include unvalidated device data.  Last Pain:  Vitals:   11/26/17 1342  TempSrc: Tympanic  PainSc: 0-No pain         Complications: No apparent anesthesia complications

## 2017-11-26 NOTE — Anesthesia Postprocedure Evaluation (Signed)
Anesthesia Post Note  Patient: Alexis Atkins  Procedure(s) Performed: COLONOSCOPY WITH PROPOFOL (N/A )  Patient location during evaluation: Endoscopy Anesthesia Type: General Level of consciousness: awake and alert Pain management: pain level controlled Vital Signs Assessment: post-procedure vital signs reviewed and stable Respiratory status: spontaneous breathing, nonlabored ventilation, respiratory function stable and patient connected to nasal cannula oxygen Cardiovascular status: blood pressure returned to baseline and stable Postop Assessment: no apparent nausea or vomiting Anesthetic complications: no     Last Vitals:  Vitals:   11/26/17 1342 11/26/17 1352  BP: 124/62 (!) 92/33  Pulse: 80 80  Resp: 15 16  Temp: (!) 36.2 C   SpO2: 100% 100%    Last Pain:  Vitals:   11/26/17 1352  TempSrc:   PainSc: 0-No pain                 Tobechukwu Emmick S

## 2017-11-27 ENCOUNTER — Encounter: Payer: Self-pay | Admitting: Unknown Physician Specialty

## 2017-11-27 LAB — SURGICAL PATHOLOGY

## 2018-05-16 ENCOUNTER — Ambulatory Visit: Payer: Medicare Other

## 2018-05-16 ENCOUNTER — Other Ambulatory Visit: Payer: Self-pay

## 2018-05-16 ENCOUNTER — Ambulatory Visit
Admission: EM | Admit: 2018-05-16 | Discharge: 2018-05-16 | Disposition: A | Discharge status: Home / Self Care | Payer: Medicare Other | Attending: Emergency Medicine | Admitting: Emergency Medicine

## 2018-05-16 ENCOUNTER — Encounter: Payer: Self-pay | Admitting: Emergency Medicine

## 2018-05-16 DIAGNOSIS — R509 Fever, unspecified: Secondary | ICD-10-CM | POA: Diagnosis present

## 2018-05-16 DIAGNOSIS — J09X2 Influenza due to identified novel influenza A virus with other respiratory manifestations: Secondary | ICD-10-CM | POA: Insufficient documentation

## 2018-05-16 DIAGNOSIS — Z7901 Long term (current) use of anticoagulants: Secondary | ICD-10-CM | POA: Diagnosis not present

## 2018-05-16 DIAGNOSIS — I10 Essential (primary) hypertension: Secondary | ICD-10-CM | POA: Diagnosis not present

## 2018-05-16 DIAGNOSIS — Z79899 Other long term (current) drug therapy: Secondary | ICD-10-CM | POA: Diagnosis not present

## 2018-05-16 DIAGNOSIS — D573 Sickle-cell trait: Secondary | ICD-10-CM | POA: Diagnosis not present

## 2018-05-16 DIAGNOSIS — J101 Influenza due to other identified influenza virus with other respiratory manifestations: Secondary | ICD-10-CM

## 2018-05-16 DIAGNOSIS — Z79890 Hormone replacement therapy (postmenopausal): Secondary | ICD-10-CM | POA: Insufficient documentation

## 2018-05-16 DIAGNOSIS — G43909 Migraine, unspecified, not intractable, without status migrainosus: Secondary | ICD-10-CM | POA: Insufficient documentation

## 2018-05-16 DIAGNOSIS — K219 Gastro-esophageal reflux disease without esophagitis: Secondary | ICD-10-CM | POA: Diagnosis not present

## 2018-05-16 DIAGNOSIS — M1990 Unspecified osteoarthritis, unspecified site: Secondary | ICD-10-CM | POA: Diagnosis not present

## 2018-05-16 DIAGNOSIS — F329 Major depressive disorder, single episode, unspecified: Secondary | ICD-10-CM | POA: Insufficient documentation

## 2018-05-16 DIAGNOSIS — Z853 Personal history of malignant neoplasm of breast: Secondary | ICD-10-CM | POA: Insufficient documentation

## 2018-05-16 DIAGNOSIS — F419 Anxiety disorder, unspecified: Secondary | ICD-10-CM | POA: Diagnosis not present

## 2018-05-16 DIAGNOSIS — E039 Hypothyroidism, unspecified: Secondary | ICD-10-CM | POA: Diagnosis not present

## 2018-05-16 DIAGNOSIS — Z86711 Personal history of pulmonary embolism: Secondary | ICD-10-CM | POA: Diagnosis not present

## 2018-05-16 LAB — RAPID INFLUENZA A&B ANTIGENS
Influenza A (ARMC): POSITIVE — AB
Influenza B (ARMC): NEGATIVE

## 2018-05-16 MED ORDER — HYDROCOD POLST-CPM POLST ER 10-8 MG/5ML PO SUER: 5.0000 mL | Freq: Two times a day (BID) | ORAL | 0 refills | Status: AC | PRN

## 2018-05-16 MED ORDER — BENZONATATE 200 MG PO CAPS: 200.0000 mg | ORAL_CAPSULE | Freq: Three times a day (TID) | ORAL | 0 refills | Status: AC | PRN

## 2018-05-16 MED ORDER — OSELTAMIVIR PHOSPHATE 75 MG PO CAPS: 75.0000 mg | ORAL_CAPSULE | Freq: Two times a day (BID) | ORAL | 0 refills | Status: AC

## 2018-05-16 NOTE — ED Triage Notes (Signed)
Patient c/o cough and congestion, fever that started 3 days ago. She states she has taken Mucinex and Tylenol for her symptoms.

## 2018-05-16 NOTE — Discharge Instructions (Addendum)
Your Chest x-ray was normal.  You do not have a pneumonia at this point in time.  1 g of Tylenol 4 times a day as needed for pain, fever, finish the Tamiflu even if you feel better, Tessalon for the cough during the day, Tussionex for the cough at night.  Follow-up with your primary care provider if not getting any better  in 5 days, to the ER if you get worse.

## 2018-05-16 NOTE — ED Provider Notes (Signed)
HPI  SUBJECTIVE:  Alexis Atkins is a 67 y.o. female who presents with fevers T-max 101.8, sore throat, nonproductive cough, chest soreness secondary to the cough starting 3 days ago.  She reports left-sided headache with ear pain which has now resolved.  She denies nasal congestion, rhinorrhea, postnasal drip, sinus pain or pressure, chest congestion.  No wheezing, shortness of breath, dyspnea on exertion.  No body aches.  No neck stiffness, abdominal pain.  No sensation of her throat swelling shut, difficulty breathing, drooling, trismus.  States that she is unable to sleep at night secondary to the cough.  No antibiotics in the past month.  No antipyretic in the past 4 to 6 hours.  No GERD symptoms.  She got a flu and pneumonia shot in September.  No contacts with similar symptoms.  She tried Mucinex DM, hot tea and Tylenol without improvement of symptoms.  Her symptoms are worse with coughing.  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  She has a history of hypertension, right breast cancer, in remission for 5 years, sickle cell trait, GERD, vertigo.  No history of diabetes, asthma, eczema, COPD, smoking.  PMD: Kirk Ruths, MD   Past Medical History:  Diagnosis Date  . Anxiety   . Arthritis   . Cancer [facility]) 2014   breast right side  . Depression   . GERD (gastroesophageal reflux disease)   . Headache    migraines/ approx 2 per week  . Hx of blood clots 12 yrs ago   in lungs, now on coumadin  . Hypertension    controlled on meds  . Hypothyroidism   . 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 Neuropathy    fingers, toes and balls of feet  . Pulmonary embolism ([place])   . Sickle cell trait ([place])   . Vertigo     Past Surgical History:  Procedure Laterality Date  . ARTERY BIOPSY Left 07/18/2016   Procedure: BIOPSY TEMPORAL ARTERY;  Surgeon: Clyde Canterbury, MD;  Location: [facility];   Service: ENT;  Laterality: Left;  . BREAST SURGERY Right 2014  . CESAREAN SECTION     x1  . COLONOSCOPY  05/28/2003, 09/12/2007  . COLONOSCOPY WITH PROPOFOL N/A 11/26/2017   Procedure: COLONOSCOPY WITH PROPOFOL;  Surgeon: Manya Silvas, MD;  Location: [facility] ENDOSCOPY;  Service: Endoscopy;  Laterality: N/A;  . DIAGNOSTIC LAPAROSCOPY    . DIAGNOSTIC LAPAROSCOPY    . DILATION AND CURETTAGE OF UTERUS    . ESOPHAGOGASTRODUODENOSCOPY  05/30/2001  . FOOT SURGERY Bilateral 1984 & 1985  . HAMMER TOE SURGERY Right 10/10/2017   Procedure: HAMMER TOE CORRECTION-2ND AND 3RD TOE and 4TH;  Surgeon: Samara Deist, DPM;  Location: [facility];  Service: Podiatry;  Laterality: Right;  IVA LOCAL HAMMERTOE  . KNEE ARTHROSCOPY Right 07/13/06, 05/11/14  . REPLACEMENT TOTAL KNEE Right   . TONSILLECTOMY      History reviewed. No pertinent family history.  Social History   Tobacco Use  . Smoking status: Never Smoker  . Smokeless tobacco: Never Used  Substance Use Topics  . Alcohol use: No  . Drug use: Never    No current facility-administered medications for this encounter.   Current Outpatient Medications:  .  acetaminophen (TYLENOL) 500 MG tablet, Take 500 mg by mouth every 6 (six) hours as needed., Disp: , Rfl:  .  carvedilol (COREG) 6.25 MG tablet,  Take 6.25 mg by mouth 2 (two) times daily with a meal., Disp: , Rfl:  .  Cholecalciferol (VITAMIN D3 PO), Take 600 mg by mouth 2 (two) times daily., Disp: , Rfl:  .  Cyanocobalamin (VITAMIN B-12) 500 MCG LOZG, Take 500 mcg by mouth daily. am , Disp: , Rfl:  .  DULoxetine (CYMBALTA) 20 MG capsule, Take 20 mg by mouth daily., Disp: , Rfl:  .  letrozole (FEMARA) 2.5 MG tablet, Take 2.5 mg by mouth daily. pm, Disp: , Rfl:  .  levothyroxine (SYNTHROID, LEVOTHROID) 175 MCG tablet, Take 175 mcg by mouth daily before breakfast. Am , Disp: , Rfl:  .  magnesium oxide (MAG-OX) 400 MG tablet, Take 800 mg by mouth daily., Disp: , Rfl:  .  meclizine  (ANTIVERT) 25 MG tablet, Take 1 tablet (25 mg total) by mouth 3 (three) times daily as needed for dizziness., Disp: 30 tablet, Rfl: 0 .  Multiple Vitamin (MULTIVITAMIN) tablet, Take 1 tablet by mouth daily., Disp: , Rfl:  .  ondansetron (ZOFRAN ODT) 4 MG disintegrating tablet, Take 1 tablet (4 mg total) by mouth every 8 (eight) hours as needed for nausea or vomiting., Disp: 20 tablet, Rfl: 0 .  potassium chloride (KLOR-CON) 20 MEQ packet, Take by mouth 2 (two) times daily. Am and pm, Disp: , Rfl:  .  torsemide (DEMADEX) 20 MG tablet, Take 20 mg by mouth daily. am, Disp: , Rfl:  .  warfarin (COUMADIN) 6 MG tablet, Take 6 mg by mouth daily. Pm, Disp: , Rfl:  .  benzonatate (TESSALON) 200 MG capsule, Take 1 capsule (200 mg total) by mouth 3 (three) times daily as needed for cough., Disp: 30 capsule, Rfl: 0 .  chlorpheniramine-HYDROcodone (TUSSIONEX PENNKINETIC ER) 10-8 MG/5ML SUER, Take 5 mLs by mouth every 12 (twelve) hours as needed for cough., Disp: 60 mL, Rfl: 0 .  oseltamivir (TAMIFLU) 75 MG capsule, Take 1 capsule (75 mg total) by mouth 2 (two) times daily. X 5 days, Disp: 10 capsule, Rfl: 0  Allergies  Allergen Reactions  . Asa [Aspirin]     Sickle cell trait/ told not to take  . Celebrex [Celecoxib] Swelling  . Codeine Nausea And Vomiting     ROS  As noted in HPI.   Physical Exam  BP 125/87 (BP Location: Right Arm)   Pulse 94   Temp 98.5 F (36.9 C) (Oral)   Resp 18   Ht 5\' 7"  (1.702 m)   Wt 91.6 kg   SpO2 97%   BMI 31.64 kg/m   Constitutional: Well developed, well nourished, no acute distress Eyes: PERRL, EOMI, conjunctiva normal bilaterally HENT: Normocephalic, atraumatic,mucus membranes moist. no nasal congestion.  No sinus tenderness.  Tonsils surgically absent.  Normal oropharynx. Neck: No cervical lymphadenopathy. Respiratory: Clear to auscultation bilaterally, no rales, no wheezing, no rhonchi.  Positive anterior and lateral chest wall tenderness. Cardiovascular:  Normal rate and rhythm, no murmurs, no gallops, no rubs GI: Soft, nondistended, nontender, no rebound, no guarding Back: no CVAT skin: No rash, skin intact Musculoskeletal: No edema, no deformities Neurologic: Alert & oriented x 3, CN II-XII grossly intact, no motor deficits, sensation grossly intact Psychiatric: Speech and behavior appropriate   ED Course   Medications - No data to display  Orders Placed This Encounter  Procedures  . Rapid Influenza A&B Antigens ([facility] only)    Standing Status:   Standing    Number of Occurrences:   1  . DG Chest 2 View  Standing Status:   Standing    Number of Occurrences:   1    Order Specific Question:   Reason for Exam (SYMPTOM  OR DIAGNOSIS REQUIRED)    Answer:   copugh fever r/o PNA  . Droplet precaution    Standing Status:   Standing    Number of Occurrences:   1   Results for orders placed or performed during the hospital encounter of 05/16/18 (from the past 24 hour(s))  Rapid Influenza A&B Antigens ([place] only)     Status: Abnormal   Collection Time: 05/16/18  7:15 PM  Result Value Ref Range   Influenza A ([facility]) POSITIVE (A) NEGATIVE   Influenza B ([facility]) NEGATIVE NEGATIVE   Dg Chest 2 View  Result Date: 05/16/2018 CLINICAL DATA:  Cough and fever EXAM: CHEST - 2 VIEW COMPARISON:  07/29/2009 FINDINGS: No focal opacity or pleural effusion. Borderline cardiomegaly. No pneumothorax. IMPRESSION: No active cardiopulmonary disease.  Borderline cardiomegaly. Electronically Signed   By: Donavan Foil M.D.   On: 05/16/2018 19:57    ED Clinical Impression  Influenza A   ED Assessment/Plan  Checking flu, chest x-ray because of fever and cough.  Patient flu a positive.  [place] Narcotic database reviewed for this patient, and feel that the risk/benefit ratio today is favorable for proceeding with a prescription for controlled substance.  Last opiate prescription was back in June for tramadol.  Reviewed imaging independently. No PNA. See  radiology report for full details.  1 g of Tylenol 4 times a day as needed for pain, fever, Tamiflu, Tessalon for the cough during the day Tussionex for the cough at night.  Follow-up with her primary care provider if not getting any better  in 5 days, to the ER if she gets worse.  Discussed labs, imaging, MDM, treatment plan, and plan for follow-up with patient Discussed sn/sx that should prompt return to the ED. patient agrees with plan.   Meds ordered this encounter  Medications  . benzonatate (TESSALON) 200 MG capsule    Sig: Take 1 capsule (200 mg total) by mouth 3 (three) times daily as needed for cough.    Dispense:  30 capsule    Refill:  0  . chlorpheniramine-HYDROcodone (TUSSIONEX PENNKINETIC ER) 10-8 MG/5ML SUER    Sig: Take 5 mLs by mouth every 12 (twelve) hours as needed for cough.    Dispense:  60 mL    Refill:  0  . oseltamivir (TAMIFLU) 75 MG capsule    Sig: Take 1 capsule (75 mg total) by mouth 2 (two) times daily. X 5 days    Dispense:  10 capsule    Refill:  0    *This clinic note was created using [employer]. Therefore, there may be occasional mistakes despite careful proofreading.  ?    Melynda Ripple, MD 05/16/18 2007

## 2019-06-24 ENCOUNTER — Other Ambulatory Visit: Payer: Self-pay

## 2019-06-24 ENCOUNTER — Ambulatory Visit: Payer: Medicare Other | Attending: Sports Medicine

## 2019-06-24 DIAGNOSIS — M25551 Pain in right hip: Secondary | ICD-10-CM | POA: Insufficient documentation

## 2019-06-24 DIAGNOSIS — M79605 Pain in left leg: Secondary | ICD-10-CM | POA: Insufficient documentation

## 2019-06-24 DIAGNOSIS — M47816 Spondylosis without myelopathy or radiculopathy, lumbar region: Secondary | ICD-10-CM | POA: Insufficient documentation

## 2019-06-24 DIAGNOSIS — M5136 Other intervertebral disc degeneration, lumbar region: Secondary | ICD-10-CM | POA: Insufficient documentation

## 2019-06-24 NOTE — Therapy (Signed)
[place] [facility] [address], [place], [address] Phone: [phone]   Fax:  [phone]  Physical Therapy Evaluation  Patient Details  Name: Alexis Atkins MRN: [id number] Date of Birth: 07-24-1950 No data recorded  Encounter Date: 06/24/2019  PT End of Session - 06/24/19 1712    Visit Number  1    Number of Visits  12    Date for PT Re-Evaluation  08/05/19    Authorization Type  1/10    PT Start Time  1520    PT Stop Time  1625    PT Time Calculation (min)  65 min    Activity Tolerance  Patient limited by pain    Behavior During Therapy  [facility] for tasks assessed/performed       Past Medical History:  Diagnosis Date  . Anxiety   . Arthritis   . Cancer [facility]) 2014   breast right side  . Depression   . GERD (gastroesophageal reflux disease)   . Headache    migraines/ approx 2 per week  . Hx of blood clots 12 yrs ago   in lungs, now on coumadin  . Hypertension    controlled on meds  . Hypothyroidism   . 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 Neuropathy    fingers, toes and balls of feet  . Pulmonary embolism ([place])   . Sickle cell trait ([place])   . Vertigo     Past Surgical History:  Procedure Laterality Date  . ARTERY BIOPSY Left 07/18/2016   Procedure: BIOPSY TEMPORAL ARTERY;  Surgeon: Clyde Canterbury, MD;  Location: [place];  Service: ENT;  Laterality: Left;  . BREAST SURGERY Right 2014  . CESAREAN SECTION     x1  . COLONOSCOPY  05/28/2003, 09/12/2007  . COLONOSCOPY WITH PROPOFOL N/A 11/26/2017   Procedure: COLONOSCOPY WITH PROPOFOL;  Surgeon: Manya Silvas, MD;  Location: [facility] ENDOSCOPY;  Service: Endoscopy;  Laterality: N/A;  . DIAGNOSTIC LAPAROSCOPY    . DIAGNOSTIC LAPAROSCOPY    . DILATION AND CURETTAGE OF UTERUS    . ESOPHAGOGASTRODUODENOSCOPY  05/30/2001  . FOOT SURGERY Bilateral 1984 & 1985  . HAMMER TOE SURGERY Right  10/10/2017   Procedure: HAMMER TOE CORRECTION-2ND AND 3RD TOE and 4TH;  Surgeon: Samara Deist, DPM;  Location: [place];  Service: Podiatry;  Laterality: Right;  IVA LOCAL HAMMERTOE  . KNEE ARTHROSCOPY Right 07/13/06, 05/11/14  . REPLACEMENT TOTAL KNEE Right   . TONSILLECTOMY      There were no vitals filed for this visit.   Subjective Assessment - 06/24/19 1655    Subjective  Patient is a 69 yo female that comes into clinic with R LBP and N/T down LLE. 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 2 weeks ago, patient states that she was lifting a vaccum up some stairs and started having pain after getting up stairs. Patient states that pain has gotten worse over the past few weeks. Pain is 3/10 pretty constantly unless she is moving a lot and it can flare back up to 8/10. Patient has tried aleve and tylenol for pain and Aleve seems to decrease pain more. Patient saw orthopedist last week and states she had a pinched nerve. Patient would love to get back to walking x2/day w/ her husband and get back to dancerize    Pertinent History  Breast cancer  2015, TKR - 2012    Limitations  Lifting;Standing;Walking;House hold activities    How long can you stand comfortably?  5 minutes    How long can you walk comfortably?  pain comes on immediately    Patient Stated Goals  dancerized, walking    Currently in Pain?  Yes    Pain Score  3     Pain Location  Back    Pain Orientation  Right    Pain Descriptors / Indicators  Jabbing;Sharp    Pain Type  Acute pain    Multiple Pain Sites  Yes    Pain Location  Leg    Pain Orientation  Left    Pain Type  Acute pain    Pain Radiating Towards  posterior thigh, toes        Hip IR w/ OP: (-) for reproduction of concordant pain bilaterally  AROM for Lumbar    AROM w/ OP  Extension P! limited   Flexion P! Slightly limited  Side Bending R: limited  L:  limited  Rotation R: P! WNL  L: WNL P!   Neurodynamic  Tests:  LQ NS:  Dermatomes: L L5 decreased sensation Myotomes: WNL  DTR: WNL  Slump: (+) L SLR: (+) L also limited due to hamstring flexibility  L: 35 degrees  R: 40 degrees  Distal Hamstring Length: L 115 degrees,  R 114 degrees   Seated Lumbar Extension Rotation Test: (+) for reproduction of R low back  Passive Accessory Joint Mobility: (+) limited mobility with sidelying rotation PAM L4-5 R and L  Palpation: Pain over R low back near facet joint of L4/L5       Today's Interventions: Therapeutic Exercises 1.) Supine hooklying LTR x 20 R and L 2.)SL Knees to Chest: x 15 R and L Patient was instructed to do these with her HEP to help limit pain throughout the day Pt education regarding spine anatomy/mechanics      Objective measurements completed on examination: See above findings.     PT Education - 06/24/19 1712    Education Details  POC, HEP, diagnosis    Person(s) Educated  Patient    Methods  Explanation;Demonstration;Verbal cues    Comprehension  Verbalized understanding;Returned demonstration;Verbal cues required       PT Short Term Goals - 06/24/19 1715      PT SHORT TERM GOAL #1   Title  Patient will be independent in her HEP in order to improve her to PLOF    Time  2    Period  Weeks    Target Date  07/08/19      PT SHORT TERM GOAL #2   Title  Patient will have no sx at baseline and no increase in pain at end of therapy session    Baseline  3/10 pain    Time  3    Period  Weeks    Target Date  07/15/19      PT SHORT TERM GOAL #3   Title  Patient will be able to properly demonstrate bed mobility to help limit increase in pain w/ transitions    Time  2    Period  Weeks    Target Date  07/08/19        PT Long Term Goals - 06/24/19 1717      PT LONG TERM GOAL #1   Title  Patient will be able to walk 515ft w/o increase in pain    Time  6  Period  Weeks    Target Date  08/05/19      PT LONG TERM GOAL #2   Title  Patient will be able  to squat demonstrating proper body mechanics in order to lift objects pain free    Time  6    Period  Weeks    Target Date  08/05/19      PT LONG TERM GOAL #4   Title  Patient will be able to walk 1 mile w/o pain so she can feel comfortable walking around at grocery store    Time  12    Period  Weeks    Target Date  09/16/19             Plan - 06/24/19 1729    Clinical Impression Statement  Pt is a pleasant 69yo female dealing with RLBP and radicular sx down contralateral leg. Patient showed general limitations with lumbar AROM, PROM for rotation and lumbar spine mobility.  Patient showed limitations with SLR and WSLR due to pain and tight hamstrings. Pt demonstrated poor tolerance for lumbar extension type movements/positioning today (prone on ebow) as sx's peripheralize into L LE in this position.  Patient also had a positive slump test on left and decreased dermatomal sensation at L5 indicating neural involvement. Patient reported isolated pain over L4/L5 R facet joint w/ extension rotation test. Patient is a good candidate for physical therapy and would benefit from skilled therapy to help improve her symptoms and return her to PLOF.    Examination-Activity Limitations  Stand;Lift    Stability/Clinical Decision Making  Evolving/Moderate complexity    Clinical Decision Making  Moderate    Rehab Potential  Good    PT Frequency  2x / week    PT Duration  6 weeks    PT Treatment/Interventions  ADLs/Self Care Home Management;Cryotherapy;Electrical Stimulation;Iontophoresis 4mg /ml Dexamethasone;Moist Heat;Traction;Gait training;Stair training;Functional mobility training;Therapeutic activities;Therapeutic exercise;Balance training;Neuromuscular re-education;Patient/family education;Manual techniques;Passive range of motion;Dry needling;Joint Manipulations    PT Next Visit Plan  lumbar flexion, MT, rotational mobs    PT Home Exercise Plan  LTR, SL knees to chest, ice/heat for pain     Consulted and Agree with Plan of Care  Patient       Patient will benefit from skilled therapeutic intervention in order to improve the following deficits and impairments:  Decreased activity tolerance, Decreased endurance, Difficulty walking, Decreased range of motion, Abnormal gait, Pain, Decreased balance, Hypomobility, Improper body mechanics, Decreased strength  Visit Diagnosis: Pain in right hip  Pain in left leg  DDD (degenerative disc disease), lumbar  Lumbar spondylosis     Problem List There are no problems to display for this patient.   Gloriann Loan, SPT Pincus Badder    06/24/2019, 6:42 PM  Merdis Delay, PT, DPT Physical Therapist - [place] [facility] [address], [place], [address] Phone: [phone]   Fax:  [phone]  Name: Alexis Atkins MRN: [id number] Date of Birth: 01/06/1951

## 2019-06-30 ENCOUNTER — Ambulatory Visit: Payer: Medicare Other | Attending: Sports Medicine

## 2019-06-30 ENCOUNTER — Other Ambulatory Visit: Payer: Self-pay

## 2019-06-30 DIAGNOSIS — G8929 Other chronic pain: Secondary | ICD-10-CM | POA: Insufficient documentation

## 2019-06-30 DIAGNOSIS — M25551 Pain in right hip: Secondary | ICD-10-CM | POA: Diagnosis present

## 2019-06-30 DIAGNOSIS — M79605 Pain in left leg: Secondary | ICD-10-CM | POA: Insufficient documentation

## 2019-06-30 DIAGNOSIS — M5416 Radiculopathy, lumbar region: Secondary | ICD-10-CM | POA: Insufficient documentation

## 2019-06-30 DIAGNOSIS — M47816 Spondylosis without myelopathy or radiculopathy, lumbar region: Secondary | ICD-10-CM | POA: Diagnosis present

## 2019-06-30 DIAGNOSIS — M5136 Other intervertebral disc degeneration, lumbar region: Secondary | ICD-10-CM | POA: Diagnosis present

## 2019-06-30 NOTE — Therapy (Signed)
[place] [facility] [address] [place], [address] Phone: [phone]   Fax:  [phone]  Physical Therapy Treatment  Patient Details  Name: Alexis Atkins MRN: [id number] Date of Birth: 1950-07-18 No data recorded  Encounter Date: 06/30/2019  PT End of Session - 06/30/19 1550    Visit Number  2    Number of Visits  12    Date for PT Re-Evaluation  08/05/19    Authorization Type  2/10    PT Start Time  1435    PT Stop Time  1520    PT Time Calculation (min)  45 min    Activity Tolerance  Patient limited by pain;Patient tolerated treatment well    Behavior During Therapy  [facility] for tasks assessed/performed       Past Medical History:  Diagnosis Date  . Anxiety   . Arthritis   . Cancer [facility]) 2014   breast right side  . Depression   . GERD (gastroesophageal reflux disease)   . Headache    migraines/ approx 2 per week  . Hx of blood clots 12 yrs ago   in lungs, now on coumadin  . Hypertension    controlled on meds  . Hypothyroidism   . 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 Neuropathy    fingers, toes and balls of feet  . Pulmonary embolism ([place])   . Sickle cell trait ([place])   . Vertigo     Past Surgical History:  Procedure Laterality Date  . ARTERY BIOPSY Left 07/18/2016   Procedure: BIOPSY TEMPORAL ARTERY;  Surgeon: Clyde Canterbury, MD;  Location: [place];  Service: ENT;  Laterality: Left;  . BREAST SURGERY Right 2014  . CESAREAN SECTION     x1  . COLONOSCOPY  05/28/2003, 09/12/2007  . COLONOSCOPY WITH PROPOFOL N/A 11/26/2017   Procedure: COLONOSCOPY WITH PROPOFOL;  Surgeon: Manya Silvas, MD;  Location: [facility] ENDOSCOPY;  Service: Endoscopy;  Laterality: N/A;  . DIAGNOSTIC LAPAROSCOPY    . DIAGNOSTIC LAPAROSCOPY    . DILATION AND CURETTAGE OF UTERUS    . ESOPHAGOGASTRODUODENOSCOPY  05/30/2001  . FOOT SURGERY Bilateral 1984 & 1985   . HAMMER TOE SURGERY Right 10/10/2017   Procedure: HAMMER TOE CORRECTION-2ND AND 3RD TOE and 4TH;  Surgeon: Samara Deist, DPM;  Location: [place];  Service: Podiatry;  Laterality: Right;  IVA LOCAL HAMMERTOE  . KNEE ARTHROSCOPY Right 07/13/06, 05/11/14  . REPLACEMENT TOTAL KNEE Right   . TONSILLECTOMY      There were no vitals filed for this visit.  Subjective Assessment - 06/30/19 1440    Subjective  Pt states she worked on her HEP from last session.  She does not have questions about the exercises.  She reports the R low back feels about the same as last week.  Her R thigh is sore too- maybe from exercises.  The L leg feels "heavy" and she feels like the leg drags sometimes.    Pertinent History  Breast cancer 2015, TKR - 2012    Limitations  Lifting;Standing;Walking;House hold activities    How long can you stand comfortably?  5 minutes    How long can you walk comfortably?  pain comes on immediately    Patient Stated Goals  dancerized, walking       Treatment today:  Baseline sxs: pt reports "heaviness" in L LE (mostly thigh, ant lower  leg), and R low back pain 3/10 (sharp pain)  Therapeutic Exercise: Seated lumbar flexion: 2x10 (had no effect on R low back or L LE sx's) Supine SKTC with strap 3x10 sec R and L Supine hooklying gentle trunk rotation (knees R/L) alternating 20x Diaphragmatic breathing x10 during session Sidelying L with hips flexed to 90 degrees and pt R trunk rotation 2x8 (after manual therapy) Seated heel raises 2x15 (L fatigued quicker than R)   Manual Therapy: Manual glute max stretch R and L 3x20 sec Manual SKTC R and L 3x10 sec Manual sidelying L4-5 mob Gr 2-3 30 sec x 3 ea (pt reports R sidelying reproduced R sharp low back pain, so discontinued and just focused on L sidelying)     Home Exercises:   Access Code: [email or web address]  URL: [email or web address]  Date: 06/30/2019  Prepared by: Merdis Delay   Exercises Hooklying  Single Knee to Chest Stretch - 5-10 reps - 10 hold - 2x daily - 7x weekly Hooklying Lumbar Rotation - 20 reps - 2x daily - 7x weekly Sidelying Thoracic Lumbar Rotation - 10 reps - 2 sets - 2x daily - 7x weekly Seated Toe Raise - 15 reps - 2 sets - 5 hold - 2x daily - 7x weekly    Therapeutic exercise and manual therapy tx provided to address lumbar spine ROM/mobility deficits, pain, LE flexibility deficits, and LE strength deficits   PT Education - 06/30/19 1549    Education Details  HEP (see medbridge code in note), purpose of exerciess, spine anatomy/n. function    Person(s) Educated  Patient    Methods  Explanation;Tactile cues    Comprehension  Verbalized understanding;Returned demonstration       PT Short Term Goals - 06/24/19 1715      PT SHORT TERM GOAL #1   Title  Patient will be independent in her HEP in order to improve her to PLOF    Time  2    Period  Weeks    Target Date  07/08/19      PT SHORT TERM GOAL #2   Title  Patient will have no sx at baseline and no increase in pain at end of therapy session    Baseline  3/10 pain    Time  3    Period  Weeks    Target Date  07/15/19      PT SHORT TERM GOAL #3   Title  Patient will be able to properly demonstrate bed mobility to help limit increase in pain w/ transitions    Time  2    Period  Weeks    Target Date  07/08/19        PT Long Term Goals - 06/24/19 1717      PT LONG TERM GOAL #1   Title  Patient will be able to walk 576ft w/o increase in pain    Time  6    Period  Weeks    Target Date  08/05/19      PT LONG TERM GOAL #2   Title  Patient will be able to squat demonstrating proper body mechanics in order to lift objects pain free    Time  6    Period  Weeks    Target Date  08/05/19      PT LONG TERM GOAL #4   Title  Patient will be able to walk 1 mile w/o pain so she can feel comfortable walking around at grocery store    Time  12    Period  Weeks    Target Date  09/16/19             Plan - 06/30/19 1512    Clinical Impression Statement  Pt's L ankle DF weakness was visible during ambulation in clinic as pt dragged her foot instead of achieving L heel strike during initial contact.  Pt tolerated self ROM better than manual therapy techniques for L sidelying R trunk rotation with report of decreased R "sharp" low back pain when she initiated the movement.  She also tolerated SKTC with strap behind knee better than using hands for this exercise because the strap allowed R quads/hip flexors to stay relaxed.  Overactivation of these mm during [place] contributed to increased R thigh soreness from her HEP last week.  She would cont to benefit from skilled PT to meet goals and facilitate return to PLOF.    Examination-Activity Limitations  Stand;Lift    Stability/Clinical Decision Making  Evolving/Moderate complexity    Rehab Potential  Good    PT Frequency  2x / week    PT Duration  6 weeks    PT Treatment/Interventions  ADLs/Self Care Home Management;Cryotherapy;Electrical Stimulation;Iontophoresis 4mg /ml Dexamethasone;Moist Heat;Traction;Gait training;Stair training;Functional mobility training;Therapeutic activities;Therapeutic exercise;Balance training;Neuromuscular re-education;Patient/family education;Manual techniques;Passive range of motion;Dry needling;Joint Manipulations    PT Next Visit Plan  lumbar flexion, MT, rotational mobs, LE nerve mobilization    PT Home Exercise Plan  added seated toe raises, sidleying trunk rot to R in L sidelying to HEP    Consulted and Agree with Plan of Care  Patient       Patient will benefit from skilled therapeutic intervention in order to improve the following deficits and impairments:  Decreased activity tolerance, Decreased endurance, Difficulty walking, Decreased range of motion, Abnormal gait, Pain, Decreased balance, Hypomobility, Improper body mechanics, Decreased strength  Visit Diagnosis: Pain in right hip  Pain in  left leg  DDD (degenerative disc disease), lumbar  Lumbar spondylosis     Problem List There are no problems to display for this patient.   Pincus Badder 06/30/2019, 4:49 PM  Merdis Delay, PT, DPT Physical Therapist - [place] [facility] [address], [place], [address] Phone: [phone]   Fax:  [phone]  Name: Alexis Atkins MRN: [id number] Date of Birth: Aug 11, 1950

## 2019-07-02 ENCOUNTER — Other Ambulatory Visit: Payer: Self-pay

## 2019-07-02 ENCOUNTER — Ambulatory Visit: Payer: Medicare Other

## 2019-07-02 DIAGNOSIS — M47816 Spondylosis without myelopathy or radiculopathy, lumbar region: Secondary | ICD-10-CM

## 2019-07-02 DIAGNOSIS — M79605 Pain in left leg: Secondary | ICD-10-CM

## 2019-07-02 DIAGNOSIS — M5416 Radiculopathy, lumbar region: Secondary | ICD-10-CM

## 2019-07-02 DIAGNOSIS — M25551 Pain in right hip: Secondary | ICD-10-CM | POA: Diagnosis not present

## 2019-07-02 DIAGNOSIS — M5136 Other intervertebral disc degeneration, lumbar region: Secondary | ICD-10-CM

## 2019-07-02 DIAGNOSIS — G8929 Other chronic pain: Secondary | ICD-10-CM

## 2019-07-02 NOTE — Therapy (Signed)
[place] [facility] [address], [place], [address] Phone: [phone]   Fax:  [phone]  Physical Therapy Treatment  Patient Details  Name: Alexis Atkins MRN: [id number] Date of Birth: [birth year] No data recorded  Encounter Date: 07/02/2019  PT End of Session - 07/02/19 1537    Visit Number  3    Number of Visits  12    Date for PT Re-Evaluation  08/05/19    Authorization Type  3/10    PT Start Time  1430    PT Stop Time  1515    PT Time Calculation (min)  45 min    Activity Tolerance  Patient limited by pain;Patient tolerated treatment well    Behavior During Therapy  [facility] for tasks assessed/performed       Past Medical History:  Diagnosis Date  . Anxiety   . Arthritis   . Cancer [facility]) 2014   breast right side  . Depression   . GERD (gastroesophageal reflux disease)   . Headache    migraines/ approx 2 per week  . Hx of blood clots 12 yrs ago   in lungs, now on coumadin  . Hypertension    controlled on meds  . Hypothyroidism   . 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 Neuropathy    fingers, toes and balls of feet  . Pulmonary embolism ([place])   . Sickle cell trait ([place])   . Vertigo     Past Surgical History:  Procedure Laterality Date  . ARTERY BIOPSY Left 07/18/2016   Procedure: BIOPSY TEMPORAL ARTERY;  Surgeon: Clyde Canterbury, MD;  Location: [place];  Service: ENT;  Laterality: Left;  . BREAST SURGERY Right 2014  . CESAREAN SECTION     x1  . COLONOSCOPY  05/28/2003, 09/12/2007  . COLONOSCOPY WITH PROPOFOL N/A 11/26/2017   Procedure: COLONOSCOPY WITH PROPOFOL;  Surgeon: Manya Silvas, MD;  Location: [facility] ENDOSCOPY;  Service: Endoscopy;  Laterality: N/A;  . DIAGNOSTIC LAPAROSCOPY    . DIAGNOSTIC LAPAROSCOPY    . DILATION AND CURETTAGE OF UTERUS    . ESOPHAGOGASTRODUODENOSCOPY  05/30/2001  . FOOT SURGERY Bilateral 1984 & 1985   . HAMMER TOE SURGERY Right 10/10/2017   Procedure: HAMMER TOE CORRECTION-2ND AND 3RD TOE and 4TH;  Surgeon: Samara Deist, DPM;  Location: [place];  Service: Podiatry;  Laterality: Right;  IVA LOCAL HAMMERTOE  . KNEE ARTHROSCOPY Right 07/13/06, 05/11/14  . REPLACEMENT TOTAL KNEE Right   . TONSILLECTOMY      There were no vitals filed for this visit.  Subjective Assessment - 07/02/19 1533    Subjective  Pt states that she has been diligent in her HEP. Patient has been walking a lot at [facility] but can't stay the whole time due to increase in pain and N/T    Pertinent History  Breast cancer 2015, TKR - 2012    Limitations  Lifting;Standing;Walking;House hold activities    How long can you stand comfortably?  5 minutes    How long can you walk comfortably?  pain comes on immediately    Patient Stated Goals  dancerized, walking    Currently in Pain?  Yes    Pain Score  3     Pain Location  Back    Pain Orientation  Right    Pain Type  Acute pain    Pain Location  Leg  Pain Orientation  Left    Pain Type  Acute pain      TREATMENT  TE LTR x 20 ea side SL Knee to Chest Pulls x 20 ea side  SL Rotational Knee to Chest Pulls x 10 ea side Prone position w/ two pillows at head 2 x 20 to elbow press ups  Standing Lumbar extensions x 15  DF Nerve glide w/ knee in full extension x 10   MT STM over R lumbar extensors and Multifidi x 3 minutes UPA over L2, L3, L4 - 1 minute each   Therapeutic exercise and Manual therapy utilized to decrease patient's pain and increase their range of motion    PT Education - 07/02/19 1536    Education Details  form/technique with exercise,    Person(s) Educated  Patient    Methods  Demonstration;Explanation;Tactile cues    Comprehension  Returned demonstration;Verbal cues required;Tactile cues required       PT Short Term Goals - 06/24/19 1715      PT SHORT TERM GOAL #1   Title  Patient will be independent in her HEP in order to  improve her to PLOF    Time  2    Period  Weeks    Target Date  07/08/19      PT SHORT TERM GOAL #2   Title  Patient will have no sx at baseline and no increase in pain at end of therapy session    Baseline  3/10 pain    Time  3    Period  Weeks    Target Date  07/15/19      PT SHORT TERM GOAL #3   Title  Patient will be able to properly demonstrate bed mobility to help limit increase in pain w/ transitions    Time  2    Period  Weeks    Target Date  07/08/19        PT Long Term Goals - 06/24/19 1717      PT LONG TERM GOAL #1   Title  Patient will be able to walk 561ft w/o increase in pain    Time  6    Period  Weeks    Target Date  08/05/19      PT LONG TERM GOAL #2   Title  Patient will be able to squat demonstrating proper body mechanics in order to lift objects pain free    Time  6    Period  Weeks    Target Date  08/05/19      PT LONG TERM GOAL #4   Title  Patient will be able to walk 1 mile w/o pain so she can feel comfortable walking around at grocery store    Time  12    Period  Weeks    Target Date  09/16/19            Plan - 07/02/19 1539    Clinical Impression Statement  Pt tolerated being in prone to work on Prone press ups to improve IV joint space. Patient had improved lumbar AROM after mobility work and manual therapy. Patient still shows radicular sx at near end range of knee extension in slump test position. Will continue to progress with prone press ups to help reduce radicular sx. Patient will continue to benefit from manual therapy and mobilization of low back to improve her sx and retun her to PLOF    Examination-Activity Limitations  Stand;Lift    Stability/Clinical Decision Making  Evolving/Moderate complexity    Clinical Decision Making  Moderate    Rehab Potential  Good    PT Frequency  2x / week    PT Duration  6 weeks    PT Treatment/Interventions  ADLs/Self Care Home Management;Cryotherapy;Electrical Stimulation;Iontophoresis 4mg /ml  Dexamethasone;Moist Heat;Traction;Gait training;Stair training;Functional mobility training;Therapeutic activities;Therapeutic exercise;Balance training;Neuromuscular re-education;Patient/family education;Manual techniques;Passive range of motion;Dry needling;Joint Manipulations    PT Next Visit Plan  lumbar flexion, MT, rotational mobs, LE nerve mobilization, prone press ups    PT Home Exercise Plan  added seated toe raises, sidleying trunk rot to R in L sidelying to HEP    Consulted and Agree with Plan of Care  Patient       Patient will benefit from skilled therapeutic intervention in order to improve the following deficits and impairments:  Decreased activity tolerance, Decreased endurance, Difficulty walking, Decreased range of motion, Abnormal gait, Pain, Decreased balance, Hypomobility, Improper body mechanics, Decreased strength  Visit Diagnosis: Pain in left leg  Lumbar spondylosis  DDD (degenerative disc disease), lumbar  Chronic radicular low back pain     Problem List There are no problems to display for this patient.   Gloriann Loan, SPT 07/02/2019, 3:59 PM  [place] [facility] [address], [place], [address] Phone: [phone]   Fax:  [phone]  Name: Alexis Atkins MRN: [id number] Date of Birth: 08/14/1950

## 2019-07-07 ENCOUNTER — Ambulatory Visit: Payer: Medicare Other

## 2019-07-07 ENCOUNTER — Other Ambulatory Visit: Payer: Self-pay

## 2019-07-07 DIAGNOSIS — M47816 Spondylosis without myelopathy or radiculopathy, lumbar region: Secondary | ICD-10-CM

## 2019-07-07 DIAGNOSIS — G8929 Other chronic pain: Secondary | ICD-10-CM

## 2019-07-07 DIAGNOSIS — M5416 Radiculopathy, lumbar region: Secondary | ICD-10-CM

## 2019-07-07 DIAGNOSIS — M5136 Other intervertebral disc degeneration, lumbar region: Secondary | ICD-10-CM

## 2019-07-07 DIAGNOSIS — M79605 Pain in left leg: Secondary | ICD-10-CM

## 2019-07-07 DIAGNOSIS — M25551 Pain in right hip: Secondary | ICD-10-CM | POA: Diagnosis not present

## 2019-07-07 NOTE — Therapy (Signed)
[place] [facility] [address] [place], [address] Phone: [phone]   Fax:  [phone]  Physical Therapy Treatment  Patient Details  Name: Alexis Atkins MRN: [id number] Date of Birth: 1950-12-22 No data recorded  Encounter Date: 07/07/2019  PT End of Session - 07/07/19 1208    Visit Number  4    Number of Visits  12    Date for PT Re-Evaluation  08/05/19    Authorization Type  4/10    PT Start Time  1115    PT Stop Time  1200    PT Time Calculation (min)  45 min    Activity Tolerance  Patient limited by pain;Patient tolerated treatment well    Behavior During Therapy  [facility] for tasks assessed/performed       Past Medical History:  Diagnosis Date  . Anxiety   . Arthritis   . Cancer [facility]) 2014   breast right side  . Depression   . GERD (gastroesophageal reflux disease)   . Headache    migraines/ approx 2 per week  . Hx of blood clots 12 yrs ago   in lungs, now on coumadin  . Hypertension    controlled on meds  . Hypothyroidism   . 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 Neuropathy    fingers, toes and balls of feet  . Pulmonary embolism ([place])   . Sickle cell trait ([place])   . Vertigo     Past Surgical History:  Procedure Laterality Date  . ARTERY BIOPSY Left 07/18/2016   Procedure: BIOPSY TEMPORAL ARTERY;  Surgeon: Clyde Canterbury, MD;  Location: [place];  Service: ENT;  Laterality: Left;  . BREAST SURGERY Right 2014  . CESAREAN SECTION     x1  . COLONOSCOPY  05/28/2003, 09/12/2007  . COLONOSCOPY WITH PROPOFOL N/A 11/26/2017   Procedure: COLONOSCOPY WITH PROPOFOL;  Surgeon: Manya Silvas, MD;  Location: [facility] ENDOSCOPY;  Service: Endoscopy;  Laterality: N/A;  . DIAGNOSTIC LAPAROSCOPY    . DIAGNOSTIC LAPAROSCOPY    . DILATION AND CURETTAGE OF UTERUS    . ESOPHAGOGASTRODUODENOSCOPY  05/30/2001  . FOOT SURGERY Bilateral 1984 & 1985   . HAMMER TOE SURGERY Right 10/10/2017   Procedure: HAMMER TOE CORRECTION-2ND AND 3RD TOE and 4TH;  Surgeon: Samara Deist, DPM;  Location: [place];  Service: Podiatry;  Laterality: Right;  IVA LOCAL HAMMERTOE  . KNEE ARTHROSCOPY Right 07/13/06, 05/11/14  . REPLACEMENT TOTAL KNEE Right   . TONSILLECTOMY      There were no vitals filed for this visit.  Subjective Assessment - 07/07/19 1119    Subjective  Patient states that her LBP on R has gone away but has shifted to her L side. Patient continues to deal w/ heaviness in legs    Pertinent History  Breast cancer 2015, TKR - 2012    Limitations  Lifting;Standing;Walking;House hold activities    How long can you stand comfortably?  5 minutes    How long can you walk comfortably?  pain comes on immediately    Patient Stated Goals  dancerized, walking    Currently in Pain?  Yes    Pain Location  Back    Pain Orientation  Left    Pain Type  Acute pain    Multiple Pain Sites  Yes    Pain Location  Leg    Pain Orientation  Left  Pain Type  Acute pain        TREATMENT  TE  Supine SLR Nerve Glide at ankle x 15 ea position (flat leg, one towel, two towels, pillow, under hand at ~10 degrees, ~15 degrees)  Prone positioning - 2 pillows under chest, 1 pillow, prone, 1 pillow under hips (patient had heaviness w/ each position and no change in sx) Prone press ups on elbows x 10 (no change in sx)  Standing Extensions 2 x 10  SNAGS x 10  Sitting pelvic tilts (cue at PSIS for pushing low back out against fingers and then draw low back away)   Therapeutic exercises conducted to decrease pain and increase IV space    PT Education - 07/07/19 1207    Education Details  form/technique w/ exericse    Person(s) Educated  Patient    Methods  Explanation;Demonstration;Tactile cues    Comprehension  Verbalized understanding;Returned demonstration;Verbal cues required;Tactile cues required       PT Short Term Goals - 06/24/19 1715       PT SHORT TERM GOAL #1   Title  Patient will be independent in her HEP in order to improve her to PLOF    Time  2    Period  Weeks    Target Date  07/08/19      PT SHORT TERM GOAL #2   Title  Patient will have no sx at baseline and no increase in pain at end of therapy session    Baseline  3/10 pain    Time  3    Period  Weeks    Target Date  07/15/19      PT SHORT TERM GOAL #3   Title  Patient will be able to properly demonstrate bed mobility to help limit increase in pain w/ transitions    Time  2    Period  Weeks    Target Date  07/08/19        PT Long Term Goals - 06/24/19 1717      PT LONG TERM GOAL #1   Title  Patient will be able to walk 575ft w/o increase in pain    Time  6    Period  Weeks    Target Date  08/05/19      PT LONG TERM GOAL #2   Title  Patient will be able to squat demonstrating proper body mechanics in order to lift objects pain free    Time  6    Period  Weeks    Target Date  08/05/19      PT LONG TERM GOAL #4   Title  Patient will be able to walk 1 mile w/o pain so she can feel comfortable walking around at grocery store    Time  12    Period  Weeks    Target Date  09/16/19            Plan - 07/07/19 1208    Clinical Impression Statement  Pt had improvement w/ "heaviness" of legs w/ supine SLR as evidenced by increase hip flexion ROM. However, patient was unable to tolerate much of prone positioning due possibly due to increase in irritation of nerve from SLR. Patient was able to tolerate stadning extensions and pelvic tilts w/o increase in radicular sx but demonstrates decreased proprioception w/ each exercise as evidenced by difficulty w/ initiating movement. Patient would continue to progress with continued skilled therapy to return her PLOF.    Examination-Activity Limitations  Stand;Lift  Stability/Clinical Decision Making  Evolving/Moderate complexity    Rehab Potential  Good    PT Frequency  2x / week    PT Duration  6  weeks    PT Treatment/Interventions  ADLs/Self Care Home Management;Cryotherapy;Electrical Stimulation;Iontophoresis 4mg /ml Dexamethasone;Moist Heat;Traction;Gait training;Stair training;Functional mobility training;Therapeutic activities;Therapeutic exercise;Balance training;Neuromuscular re-education;Patient/family education;Manual techniques;Passive range of motion;Dry needling;Joint Manipulations    PT Next Visit Plan  lumbar flexion, MT, rotational mobs, LE nerve mobilization, prone press ups    PT Home Exercise Plan  seated toe raises, sidleying trunk rot to R in L sidelying to HEP, seated pelvic tilts, standing extensions    Consulted and Agree with Plan of Care  Patient       Patient will benefit from skilled therapeutic intervention in order to improve the following deficits and impairments:  Decreased activity tolerance, Decreased endurance, Difficulty walking, Decreased range of motion, Abnormal gait, Pain, Decreased balance, Hypomobility, Improper body mechanics, Decreased strength  Visit Diagnosis: Pain in left leg  Lumbar spondylosis  DDD (degenerative disc disease), lumbar  Chronic radicular low back pain     Problem List There are no problems to display for this patient.   Gloriann Loan, SPT  07/07/2019, 12:15 PM  [place] [facility] [address], [place], [address] Phone: [phone]   Fax:  [phone]  Name: Alexis Atkins MRN: [id number] Date of Birth: 1951/01/28

## 2019-07-09 ENCOUNTER — Other Ambulatory Visit: Payer: Self-pay

## 2019-07-09 ENCOUNTER — Ambulatory Visit: Payer: Medicare Other

## 2019-07-09 DIAGNOSIS — M79605 Pain in left leg: Secondary | ICD-10-CM

## 2019-07-09 DIAGNOSIS — M25551 Pain in right hip: Secondary | ICD-10-CM | POA: Diagnosis not present

## 2019-07-09 DIAGNOSIS — G8929 Other chronic pain: Secondary | ICD-10-CM

## 2019-07-09 DIAGNOSIS — M5136 Other intervertebral disc degeneration, lumbar region: Secondary | ICD-10-CM

## 2019-07-09 NOTE — Therapy (Signed)
[place] [facility] [address] [place], [address] Phone: [phone]   Fax:  [phone]  Physical Therapy Treatment  Patient Details  Name: Alexis Atkins MRN: [id number] Date of Birth: 1950/11/05 No data recorded  Encounter Date: 07/09/2019  PT End of Session - 07/09/19 1134    Visit Number  5    Number of Visits  12    Date for PT Re-Evaluation  08/05/19    Authorization Type  5/10    PT Start Time  1115    PT Stop Time  1200    PT Time Calculation (min)  45 min    Activity Tolerance  Patient limited by pain;Patient tolerated treatment well    Behavior During Therapy  [facility] for tasks assessed/performed       Past Medical History:  Diagnosis Date  . Anxiety   . Arthritis   . Cancer [facility]) 2014   breast right side  . Depression   . GERD (gastroesophageal reflux disease)   . Headache    migraines/ approx 2 per week  . Hx of blood clots 12 yrs ago   in lungs, now on coumadin  . Hypertension    controlled on meds  . Hypothyroidism   . 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 Neuropathy    fingers, toes and balls of feet  . Pulmonary embolism ([place])   . Sickle cell trait ([place])   . Vertigo     Past Surgical History:  Procedure Laterality Date  . ARTERY BIOPSY Left 07/18/2016   Procedure: BIOPSY TEMPORAL ARTERY;  Surgeon: Clyde Canterbury, MD;  Location: [place];  Service: ENT;  Laterality: Left;  . BREAST SURGERY Right 2014  . CESAREAN SECTION     x1  . COLONOSCOPY  05/28/2003, 09/12/2007  . COLONOSCOPY WITH PROPOFOL N/A 11/26/2017   Procedure: COLONOSCOPY WITH PROPOFOL;  Surgeon: Manya Silvas, MD;  [facility] ENDOSCOPY;  Service: Endoscopy;  Laterality: N/A;  . DIAGNOSTIC LAPAROSCOPY    . DIAGNOSTIC LAPAROSCOPY    . DILATION AND CURETTAGE OF UTERUS    . ESOPHAGOGASTRODUODENOSCOPY  05/30/2001  . FOOT SURGERY Bilateral 1984 & 1985   . HAMMER TOE SURGERY Right 10/10/2017   Procedure: HAMMER TOE CORRECTION-2ND AND 3RD TOE and 4TH;  Surgeon: Samara Deist, DPM;  Location: [place];  Service: Podiatry;  Laterality: Right;  IVA LOCAL HAMMERTOE  . KNEE ARTHROSCOPY Right 07/13/06, 05/11/14  . REPLACEMENT TOTAL KNEE Right   . TONSILLECTOMY      There were no vitals filed for this visit.  Subjective Assessment - 07/09/19 1116    Subjective  Patient states that pillow at the low back has been helping. Low back pain is very minimal now    Pertinent History  Breast cancer 2015, TKR - 2012    Limitations  Lifting;Standing;Walking;House hold activities    How long can you stand comfortably?  5 minutes    How long can you walk comfortably?  pain comes on immediately    Patient Stated Goals  dancerized, walking    Currently in Pain?  Yes    Pain Score  1     Pain Location  Back    Pain Orientation  Left       TREATMENT  TE LLE SLR (+) at ~25 degrees of hip flexion - pretreatment Prone Press ups to elbows x 5 no reduction in  heaviness so discontinued  Standing Lumbar extensions 2 x 15  Seated pelvic tilts 2 x 10  LLE SLR (+) ~18 degrees of hip flexion - post treatment Pregait training on LLE - extensive cueing for wt shifting 2 x 15  STS 3 x 8 - extensive cueing for knees out and extension in standing  LTR x 25 ea KTC w/ Red physio ball x 15  SL KTC x 5 ea - faded due to increase in LBP   Therapeutic exercises conducted to decrease patient's low back pain and improve functioning   PT Education - 07/09/19 1132    Education Details  form/ technique w/ exercise    Person(s) Educated  Patient    Methods  Explanation;Demonstration;Tactile cues    Comprehension  Verbalized understanding;Returned demonstration;Verbal cues required;Tactile cues required       PT Short Term Goals - 06/24/19 1715      PT SHORT TERM GOAL #1   Title  Patient will be independent in her HEP in order to improve her to PLOF     Time  2    Period  Weeks    Target Date  07/08/19      PT SHORT TERM GOAL #2   Title  Patient will have no sx at baseline and no increase in pain at end of therapy session    Baseline  3/10 pain    Time  3    Period  Weeks    Target Date  07/15/19      PT SHORT TERM GOAL #3   Title  Patient will be able to properly demonstrate bed mobility to help limit increase in pain w/ transitions    Time  2    Period  Weeks    Target Date  07/08/19        PT Long Term Goals - 06/24/19 1717      PT LONG TERM GOAL #1   Title  Patient will be able to walk 568ft w/o increase in pain    Time  6    Period  Weeks    Target Date  08/05/19      PT LONG TERM GOAL #2   Title  Patient will be able to squat demonstrating proper body mechanics in order to lift objects pain free    Time  6    Period  Weeks    Target Date  08/05/19      PT LONG TERM GOAL #4   Title  Patient will be able to walk 1 mile w/o pain so she can feel comfortable walking around at grocery store    Time  12    Period  Weeks    Target Date  09/16/19            Plan - 07/09/19 1211    Clinical Impression Statement  Patient shown improvement in neuromuscular control of core musculature w/ sitting pelvic tilts, as evidenced by no need for cueing increased motion in exercise. Patient was unable to tolerate prone positioning this session due to continued "heaviness" in legs. Patient needs extensive cueing and shows a dynamic knee valgus in STS indicating decreased proprioception and strength impairments. Patient will continue to progress w/ physicl therapy to return her to PLOF    Examination-Activity Limitations  Stand;Lift    Stability/Clinical Decision Making  Evolving/Moderate complexity    Rehab Potential  Good    PT Frequency  2x / week    PT Duration  6 weeks  PT Treatment/Interventions  ADLs/Self Care Home Management;Cryotherapy;Electrical Stimulation;Iontophoresis 4mg /ml Dexamethasone;Moist Heat;Traction;Gait  training;Stair training;Functional mobility training;Therapeutic activities;Therapeutic exercise;Balance training;Neuromuscular re-education;Patient/family education;Manual techniques;Passive range of motion;Dry needling;Joint Manipulations    PT Next Visit Plan  lumbar flexion, MT, rotational mobs, LE nerve mobilization, prone press ups, pregait training    PT Home Exercise Plan  seated toe raises, sidleying trunk rot to R in L sidelying to HEP, seated pelvic tilts, standing extensions    Consulted and Agree with Plan of Care  Patient       Patient will benefit from skilled therapeutic intervention in order to improve the following deficits and impairments:  Decreased activity tolerance, Decreased endurance, Difficulty walking, Decreased range of motion, Abnormal gait, Pain, Decreased balance, Hypomobility, Improper body mechanics, Decreased strength  Visit Diagnosis: Pain in left leg  DDD (degenerative disc disease), lumbar  Chronic radicular low back pain     Problem List There are no problems to display for this patient.   Gloriann Loan, SPT 07/09/2019, 12:19 PM  [place] [facility] [address] [place], [address] Phone: [phone]   Fax:  [phone]  Name: RHILEE SHEAN MRN: [id number] Date of Birth: February 03, 1951

## 2019-07-14 ENCOUNTER — Ambulatory Visit: Payer: Medicare Other

## 2019-07-17 ENCOUNTER — Ambulatory Visit: Payer: Medicare Other

## 2019-07-22 ENCOUNTER — Ambulatory Visit: Payer: Medicare Other

## 2019-07-22 ENCOUNTER — Other Ambulatory Visit: Payer: Self-pay

## 2019-07-22 DIAGNOSIS — M47816 Spondylosis without myelopathy or radiculopathy, lumbar region: Secondary | ICD-10-CM

## 2019-07-22 DIAGNOSIS — M5136 Other intervertebral disc degeneration, lumbar region: Secondary | ICD-10-CM

## 2019-07-22 DIAGNOSIS — G8929 Other chronic pain: Secondary | ICD-10-CM

## 2019-07-22 DIAGNOSIS — M79605 Pain in left leg: Secondary | ICD-10-CM

## 2019-07-22 DIAGNOSIS — M25551 Pain in right hip: Secondary | ICD-10-CM | POA: Diagnosis not present

## 2019-07-22 DIAGNOSIS — M5416 Radiculopathy, lumbar region: Secondary | ICD-10-CM

## 2019-07-22 NOTE — Therapy (Signed)
[place] [facility] [address], [place], [address] Phone: [phone]   Fax:  [phone]  Physical Therapy Treatment  Patient Details  Name: Alexis Atkins MRN: [id number] Date of Birth: 1951-05-16 No data recorded  Encounter Date: 07/22/2019  PT End of Session - 07/22/19 1404    Visit Number  6    Number of Visits  12    Date for PT Re-Evaluation  08/05/19    Authorization Type  6/10    PT Start Time  1345    PT Stop Time  1430    PT Time Calculation (min)  45 min    Activity Tolerance  Patient limited by pain;Patient tolerated treatment well    Behavior During Therapy  [facility] for tasks assessed/performed       Past Medical History:  Diagnosis Date  . Anxiety   . Arthritis   . Cancer [facility]) 2014   breast right side  . Depression   . GERD (gastroesophageal reflux disease)   . Headache    migraines/ approx 2 per week  . Hx of blood clots 12 yrs ago   in lungs, now on coumadin  . Hypertension    controlled on meds  . Hypothyroidism   . 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 Neuropathy    fingers, toes and balls of feet  . Pulmonary embolism ([place])   . Sickle cell trait ([place])   . Vertigo     Past Surgical History:  Procedure Laterality Date  . ARTERY BIOPSY Left 07/18/2016   Procedure: BIOPSY TEMPORAL ARTERY;  Surgeon: Clyde Canterbury, MD;  Location: [place];  Service: ENT;  Laterality: Left;  . BREAST SURGERY Right 2014  . CESAREAN SECTION     x1  . COLONOSCOPY  05/28/2003, 09/12/2007  . COLONOSCOPY WITH PROPOFOL N/A 11/26/2017   Procedure: COLONOSCOPY WITH PROPOFOL;  Surgeon: Manya Silvas, MD;  [facility] ENDOSCOPY;  Service: Endoscopy;  Laterality: N/A;  . DIAGNOSTIC LAPAROSCOPY    . DIAGNOSTIC LAPAROSCOPY    . DILATION AND CURETTAGE OF UTERUS    . ESOPHAGOGASTRODUODENOSCOPY  05/30/2001  . FOOT SURGERY Bilateral 1984 & 1985   . HAMMER TOE SURGERY Right 10/10/2017   Procedure: HAMMER TOE CORRECTION-2ND AND 3RD TOE and 4TH;  Surgeon: Samara Deist, DPM;  Location: [place];  Service: Podiatry;  Laterality: Right;  IVA LOCAL HAMMERTOE  . KNEE ARTHROSCOPY Right 07/13/06, 05/11/14  . REPLACEMENT TOTAL KNEE Right   . TONSILLECTOMY      There were no vitals filed for this visit.  Subjective Assessment - 07/22/19 1350    Subjective  Patient states that her back went out on her last week. Said she sat in the car for over 5 hours for and felt really bad afterwards    Pertinent History  Breast cancer 2015, TKR - 2012    Limitations  Lifting;Standing;Walking;House hold activities    How long can you stand comfortably?  5 minutes    How long can you walk comfortably?  pain comes on immediately    Patient Stated Goals  dancerized, walking    Currently in Pain?  Yes    Pain Score  2     Pain Location  Back    Pain Orientation  Left    Pain Type  Acute pain    Multiple Pain Sites  No  TREATMENT   TE Standing Extensions 2 x 20  Standing SNAGs 2 x 10 w/ limited pain  Pelvic tilts in sitting 2 x 15 w/ external cue at low back Hook lying TA activation 2 x 10 w/ 5 sec holds Sitting marches for core activation w/ hands pressing against knee 2 x 15 ea 4 sec holds Sitting marches on [employer] x 10 ea   Therapeutic Exercises conducted to increase lumbar mobility and increase anterior core strength    PT Education - 07/22/19 1403    Education Details  form/technique w/ exercise, educaiton on arthritis    Person(s) Educated  Patient    Methods  Explanation;Demonstration;Tactile cues    Comprehension  Verbalized understanding;Returned demonstration;Verbal cues required;Tactile cues required       PT Short Term Goals - 06/24/19 1715      PT SHORT TERM GOAL #1   Title  Patient will be independent in her HEP in order to improve her to PLOF    Time  2    Period  Weeks    Target Date  07/08/19       PT SHORT TERM GOAL #2   Title  Patient will have no sx at baseline and no increase in pain at end of therapy session    Baseline  3/10 pain    Time  3    Period  Weeks    Target Date  07/15/19      PT SHORT TERM GOAL #3   Title  Patient will be able to properly demonstrate bed mobility to help limit increase in pain w/ transitions    Time  2    Period  Weeks    Target Date  07/08/19        PT Long Term Goals - 06/24/19 1717      PT LONG TERM GOAL #1   Title  Patient will be able to walk 576ft w/o increase in pain    Time  6    Period  Weeks    Target Date  08/05/19      PT LONG TERM GOAL #2   Title  Patient will be able to squat demonstrating proper body mechanics in order to lift objects pain free    Time  6    Period  Weeks    Target Date  08/05/19      PT LONG TERM GOAL #4   Title  Patient will be able to walk 1 mile w/o pain so she can feel comfortable walking around at grocery store    Time  12    Period  Weeks    Target Date  09/16/19       Plan - 07/22/19 1445    Clinical Impression Statement  Patient has decrease in pain w/ lumbar extension as noted by increase in ROM prior to initiation of pain. Patient demonstrated pain w/ over pressure with SNAGs at end range of extension showing, possibly indicating decrease IV lumbar joint space. Patient would continue to progress with more aggressive ROM exercises and core activation exercises to improve patient's lumbar IV joint space. Patient continues skilled therapy to return to PLOF.    Examination-Activity Limitations  Stand;Lift    Stability/Clinical Decision Making  Evolving/Moderate complexity    Rehab Potential  Good    PT Frequency  2x / week    PT Duration  6 weeks    PT Treatment/Interventions  ADLs/Self Care Home Management;Cryotherapy;Electrical Stimulation;Iontophoresis 4mg /ml Dexamethasone;Moist Heat;Traction;Gait training;Stair training;Functional mobility  training;Therapeutic activities;Therapeutic  exercise;Balance training;Neuromuscular re-education;Patient/family education;Manual techniques;Passive range of motion;Dry needling;Joint Manipulations    PT Next Visit Plan  lumbar flexion, MT, rotational mobs, LE nerve mobilization, prone press ups, pregait training    PT Home Exercise Plan  sidleying trunk rot to R in L sidelying, seated pelvic tilts, standing extensions, Standing SNAGs    Consulted and Agree with Plan of Care  Patient       Patient will benefit from skilled therapeutic intervention in order to improve the following deficits and impairments:  Decreased activity tolerance, Decreased endurance, Difficulty walking, Decreased range of motion, Abnormal gait, Pain, Decreased balance, Hypomobility, Improper body mechanics, Decreased strength  Visit Diagnosis: Pain in left leg  Chronic radicular low back pain  DDD (degenerative disc disease), lumbar  Lumbar spondylosis     Problem List There are no problems to display for this patient.   Gloriann Loan, SPT  07/22/2019, 2:52 PM  [place] [facility] [address], [place], [address] Phone: [phone]   Fax:  [phone]  Name: Alexis Atkins MRN: [id number] Date of Birth: 1950-08-24

## 2019-07-24 ENCOUNTER — Ambulatory Visit: Payer: Medicare Other

## 2019-07-29 ENCOUNTER — Ambulatory Visit: Payer: Medicare Other | Attending: Sports Medicine

## 2019-07-29 ENCOUNTER — Other Ambulatory Visit: Payer: Self-pay

## 2019-07-29 DIAGNOSIS — M25551 Pain in right hip: Secondary | ICD-10-CM | POA: Diagnosis present

## 2019-07-29 DIAGNOSIS — M5416 Radiculopathy, lumbar region: Secondary | ICD-10-CM | POA: Diagnosis present

## 2019-07-29 DIAGNOSIS — M79605 Pain in left leg: Secondary | ICD-10-CM | POA: Diagnosis present

## 2019-07-29 DIAGNOSIS — G8929 Other chronic pain: Secondary | ICD-10-CM | POA: Diagnosis present

## 2019-07-29 DIAGNOSIS — M5136 Other intervertebral disc degeneration, lumbar region: Secondary | ICD-10-CM | POA: Diagnosis present

## 2019-07-29 NOTE — Therapy (Signed)
[place] [facility] [address] [place], [address] Phone: [phone]   Fax:  [phone]  Physical Therapy Treatment  Patient Details  Name: Alexis Atkins MRN: [id number] Date of Birth: [birth year] No data recorded  Encounter Date: 07/29/2019  PT End of Session - 07/29/19 1318    Visit Number  7    Number of Visits  12    Date for PT Re-Evaluation  08/05/19    Authorization Type  7/10    PT Start Time  1300    PT Stop Time  1340    PT Time Calculation (min)  40 min    Activity Tolerance  Patient limited by pain;Patient tolerated treatment well    Behavior During Therapy  [facility] for tasks assessed/performed       Past Medical History:  Diagnosis Date  . Anxiety   . Arthritis   . Cancer [facility]) 2014   breast right side  . Depression   . GERD (gastroesophageal reflux disease)   . Headache    migraines/ approx 2 per week  . Hx of blood clots 12 yrs ago   in lungs, now on coumadin  . Hypertension    controlled on meds  . Hypothyroidism   . 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 Neuropathy    fingers, toes and balls of feet  . Pulmonary embolism ([place])   . Sickle cell trait ([place])   . Vertigo     Past Surgical History:  Procedure Laterality Date  . ARTERY BIOPSY Left 07/18/2016   Procedure: BIOPSY TEMPORAL ARTERY;  Surgeon: Clyde Canterbury, MD;  Location: [place];  Service: ENT;  Laterality: Left;  . BREAST SURGERY Right 2014  . CESAREAN SECTION     x1  . COLONOSCOPY  05/28/2003, 09/12/2007  . COLONOSCOPY WITH PROPOFOL N/A 11/26/2017   Procedure: COLONOSCOPY WITH PROPOFOL;  Surgeon: Manya Silvas, MD;  Location: [facility] ENDOSCOPY;  Service: Endoscopy;  Laterality: N/A;  . DIAGNOSTIC LAPAROSCOPY    . DIAGNOSTIC LAPAROSCOPY    . DILATION AND CURETTAGE OF UTERUS    . ESOPHAGOGASTRODUODENOSCOPY  05/30/2001  . FOOT SURGERY Bilateral 1984 & 1985   . HAMMER TOE SURGERY Right 10/10/2017   Procedure: HAMMER TOE CORRECTION-2ND AND 3RD TOE and 4TH;  Surgeon: Samara Deist, DPM;  Location: [place];  Service: Podiatry;  Laterality: Right;  IVA LOCAL HAMMERTOE  . KNEE ARTHROSCOPY Right 07/13/06, 05/11/14  . REPLACEMENT TOTAL KNEE Right   . TONSILLECTOMY      There were no vitals filed for this visit.  Subjective Assessment - 07/29/19 1304    Subjective  Patient reports she had increased pain with bending over in the shower which is intense. She also reports the pain when she stands too long in the store.    Pertinent History  Breast cancer 2015, TKR - 2012    Limitations  Lifting;Standing;Walking;House hold activities    How long can you stand comfortably?  5 minutes    How long can you walk comfortably?  pain comes on immediately    Patient Stated Goals  dancerized, walking    Currently in Pain?  No/denies    Pain Score  2     Pain Location  Back    Pain Orientation  Left    Pain Descriptors / Indicators  Aching    Pain Type  Acute pain  TREATMENT Therapeutic Exercise Single leg mini squats with hand held assist for balance - x 7  Seated pelvic tilt - x 20 Squats in standing with intermittent UE support for strength - x 15 Hip extension in standing RTB - x 10 with 3 sec holds  Hip abduction in standing RTB -- x 10 with 3 sec holds Seated core activation with performing resisted hip flexion - x 10 with 5 sec  Performed exercises to increased muscular activation of glute med to improve strengthening in the affected areas     PT Education - 07/29/19 1310    Education Details  form/technique with exercise    Person(s) Educated  Patient    Methods  Explanation;Demonstration    Comprehension  Verbalized understanding;Returned demonstration       PT Short Term Goals - 06/24/19 1715      PT SHORT TERM GOAL #1   Title  Patient will be independent in her HEP in order to improve her to PLOF    Time  2     Period  Weeks    Target Date  07/08/19      PT SHORT TERM GOAL #2   Title  Patient will have no sx at baseline and no increase in pain at end of therapy session    Baseline  3/10 pain    Time  3    Period  Weeks    Target Date  07/15/19      PT SHORT TERM GOAL #3   Title  Patient will be able to properly demonstrate bed mobility to help limit increase in pain w/ transitions    Time  2    Period  Weeks    Target Date  07/08/19        PT Long Term Goals - 06/24/19 1717      PT LONG TERM GOAL #1   Title  Patient will be able to walk 559ft w/o increase in pain    Time  6    Period  Weeks    Target Date  08/05/19      PT LONG TERM GOAL #2   Title  Patient will be able to squat demonstrating proper body mechanics in order to lift objects pain free    Time  6    Period  Weeks    Target Date  08/05/19      PT LONG TERM GOAL #4   Title  Patient will be able to walk 1 mile w/o pain so she can feel comfortable walking around at grocery store    Time  12    Period  Weeks    Target Date  09/16/19            Plan - 07/29/19 1332    Clinical Impression Statement  Progressed into stabilization exercises as patient has overall decreased pain but continues to have difficulty with walking for prolonged periods of time and bending over. Reviewed hip hinging with exercises performed, patient required frequent verbal and tactile cueing to complete indicating poor coordination with lumbo-pelvic movements. Patient will benefit from furher skilled therapy focused on improving limitations to return to prior level of function.    Examination-Activity Limitations  Stand;Lift    Stability/Clinical Decision Making  Evolving/Moderate complexity    Rehab Potential  Good    PT Frequency  2x / week    PT Duration  6 weeks    PT Treatment/Interventions  ADLs/Self Care Home Management;Cryotherapy;Electrical Stimulation;Iontophoresis 4mg /ml Dexamethasone;Moist Heat;Traction;Gait training;Stair  training;Functional mobility training;Therapeutic activities;Therapeutic exercise;Balance training;Neuromuscular re-education;Patient/family education;Manual techniques;Passive range of motion;Dry needling;Joint Manipulations    PT Next Visit Plan  lumbar flexion, MT, rotational mobs, LE nerve mobilization, prone press ups, pregait training    PT Home Exercise Plan  sidleying trunk rot to R in L sidelying, seated pelvic tilts, standing extensions, Standing SNAGs    Consulted and Agree with Plan of Care  Patient       Patient will benefit from skilled therapeutic intervention in order to improve the following deficits and impairments:  Decreased activity tolerance, Decreased endurance, Difficulty walking, Decreased range of motion, Abnormal gait, Pain, Decreased balance, Hypomobility, Improper body mechanics, Decreased strength  Visit Diagnosis: Chronic radicular low back pain  Pain in left leg     Problem List There are no problems to display for this patient.   Blythe Stanford, PT DPT 07/29/2019, 1:42 PM  [place] [facility] [address] [place], [address] Phone: [phone]   Fax:  [phone]  Name: ANSLEIGH JAMMER MRN: [id number] Date of Birth: 06-27-1950

## 2019-07-31 ENCOUNTER — Other Ambulatory Visit: Payer: Self-pay

## 2019-07-31 ENCOUNTER — Ambulatory Visit: Payer: Medicare Other

## 2019-07-31 DIAGNOSIS — M5136 Other intervertebral disc degeneration, lumbar region: Secondary | ICD-10-CM

## 2019-07-31 DIAGNOSIS — M5416 Radiculopathy, lumbar region: Secondary | ICD-10-CM | POA: Diagnosis not present

## 2019-07-31 DIAGNOSIS — M79605 Pain in left leg: Secondary | ICD-10-CM

## 2019-07-31 DIAGNOSIS — G8929 Other chronic pain: Secondary | ICD-10-CM

## 2019-07-31 NOTE — Therapy (Signed)
[place] [facility] [address] [place], [address] Phone: [phone]   Fax:  [phone]  Physical Therapy Treatment  Patient Details  Name: Alexis Atkins MRN: [id number] Date of Birth: 01/13/1951 No data recorded  Encounter Date: 07/31/2019  PT End of Session - 07/31/19 1312    Visit Number  8    Number of Visits  12    Date for PT Re-Evaluation  08/05/19    Authorization Type  8/10    PT Start Time  1300    PT Stop Time  1345    PT Time Calculation (min)  45 min    Activity Tolerance  Patient limited by pain;Patient tolerated treatment well    Behavior During Therapy  [facility] for tasks assessed/performed       Past Medical History:  Diagnosis Date  . Anxiety   . Arthritis   . Cancer [facility]) 2014   breast right side  . Depression   . GERD (gastroesophageal reflux disease)   . Headache    migraines/ approx 2 per week  . Hx of blood clots 12 yrs ago   in lungs, now on coumadin  . Hypertension    controlled on meds  . Hypothyroidism   . 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 Neuropathy    fingers, toes and balls of feet  . Pulmonary embolism ([place])   . Sickle cell trait ([place])   . Vertigo     Past Surgical History:  Procedure Laterality Date  . ARTERY BIOPSY Left 07/18/2016   Procedure: BIOPSY TEMPORAL ARTERY;  Surgeon: Clyde Canterbury, MD;  Location: [place];  Service: ENT;  Laterality: Left;  . BREAST SURGERY Right 2014  . CESAREAN SECTION     x1  . COLONOSCOPY  05/28/2003, 09/12/2007  . COLONOSCOPY WITH PROPOFOL N/A 11/26/2017   Procedure: COLONOSCOPY WITH PROPOFOL;  Surgeon: Manya Silvas, MD;  Location: [facility] ENDOSCOPY;  Service: Endoscopy;  Laterality: N/A;  . DIAGNOSTIC LAPAROSCOPY    . DIAGNOSTIC LAPAROSCOPY    . DILATION AND CURETTAGE OF UTERUS    . ESOPHAGOGASTRODUODENOSCOPY  05/30/2001  . FOOT SURGERY Bilateral 1984 & 1985   . HAMMER TOE SURGERY Right 10/10/2017   Procedure: HAMMER TOE CORRECTION-2ND AND 3RD TOE and 4TH;  Surgeon: Samara Deist, DPM;  Location: [place];  Service: Podiatry;  Laterality: Right;  IVA LOCAL HAMMERTOE  . KNEE ARTHROSCOPY Right 07/13/06, 05/11/14  . REPLACEMENT TOTAL KNEE Right   . TONSILLECTOMY      There were no vitals filed for this visit.  Subjective Assessment - 07/31/19 1310    Subjective  Patient reports no major changes since the previous session. Patient reports no pain curently.    Pertinent History  Breast cancer 2015, TKR - 2012    Limitations  Lifting;Standing;Walking;House hold activities    How long can you stand comfortably?  5 minutes    How long can you walk comfortably?  pain comes on immediately    Patient Stated Goals  dancerized, walking    Currently in Pain?  No/denies       TREATMENT Therapeutic Exercise Seated pelvic tilt - x 20 Squats in standing with intermittent UE support for strength - x 15 Hip circles in standing with UE support - 2 x 10  Paloff press with GTB - x 15  Weight passes behind back for oblique activation - x  10 10# Hip hikes in standing with footon airex pad - x10  SLS on airex pad with two finger support - 30sec x 2 B Dead lifts with PVC pipe for technique - x 15  Performed exercises to increased muscular activation of glute med to improve strengthening in the affected areas   PT Education - 07/31/19 1312    Education Details  form/technique with exercise    Person(s) Educated  Patient    Methods  Explanation;Demonstration    Comprehension  Verbalized understanding;Returned demonstration       PT Short Term Goals - 06/24/19 1715      PT SHORT TERM GOAL #1   Title  Patient will be independent in her HEP in order to improve her to PLOF    Time  2    Period  Weeks    Target Date  07/08/19      PT SHORT TERM GOAL #2   Title  Patient will have no sx at baseline and no increase in pain at end of therapy  session    Baseline  3/10 pain    Time  3    Period  Weeks    Target Date  07/15/19      PT SHORT TERM GOAL #3   Title  Patient will be able to properly demonstrate bed mobility to help limit increase in pain w/ transitions    Time  2    Period  Weeks    Target Date  07/08/19        PT Long Term Goals - 06/24/19 1717      PT LONG TERM GOAL #1   Title  Patient will be able to walk 534ft w/o increase in pain    Time  6    Period  Weeks    Target Date  08/05/19      PT LONG TERM GOAL #2   Title  Patient will be able to squat demonstrating proper body mechanics in order to lift objects pain free    Time  6    Period  Weeks    Target Date  08/05/19      PT LONG TERM GOAL #4   Title  Patient will be able to walk 1 mile w/o pain so she can feel comfortable walking around at grocery store    Time  12    Period  Weeks    Target Date  09/16/19            Plan - 07/31/19 1328    Clinical Impression Statement  Continued to progress core and hip stbailization exercises progressing into greater exercises performed in single leg stance to improve balance and stabilization of the surrounding musculature. Patient is making progress with no increase in pain during the entirity of the session. Patient however requires cueing for proper technique indicating poor coordination along the core musculature. Patient will benefit form further skilled therapy to return to prior level of function.    Examination-Activity Limitations  Stand;Lift    Stability/Clinical Decision Making  Evolving/Moderate complexity    Rehab Potential  Good    PT Frequency  2x / week    PT Duration  6 weeks    PT Treatment/Interventions  ADLs/Self Care Home Management;Cryotherapy;Electrical Stimulation;Iontophoresis 4mg /ml Dexamethasone;Moist Heat;Traction;Gait training;Stair training;Functional mobility training;Therapeutic activities;Therapeutic exercise;Balance training;Neuromuscular re-education;Patient/family  education;Manual techniques;Passive range of motion;Dry needling;Joint Manipulations    PT Next Visit Plan  lumbar flexion, MT, rotational mobs, LE nerve mobilization, prone press ups, pregait  training    PT Home Exercise Plan  sidleying trunk rot to R in L sidelying, seated pelvic tilts, standing extensions, Standing SNAGs    Consulted and Agree with Plan of Care  Patient       Patient will benefit from skilled therapeutic intervention in order to improve the following deficits and impairments:  Decreased activity tolerance, Decreased endurance, Difficulty walking, Decreased range of motion, Abnormal gait, Pain, Decreased balance, Hypomobility, Improper body mechanics, Decreased strength  Visit Diagnosis: Chronic radicular low back pain  Pain in left leg  DDD (degenerative disc disease), lumbar     Problem List There are no problems to display for this patient.   Blythe Stanford, PT DPT 07/31/2019, 1:36 PM  [place] [facility] [address] [place], [address] Phone: [phone]   Fax:  [phone]  Name: MANNIE GLYNN MRN: [id number] Date of Birth: 1951-03-23

## 2019-08-06 ENCOUNTER — Ambulatory Visit: Payer: Medicare Other

## 2019-08-06 ENCOUNTER — Other Ambulatory Visit: Payer: Self-pay

## 2019-08-06 DIAGNOSIS — M5416 Radiculopathy, lumbar region: Secondary | ICD-10-CM | POA: Diagnosis not present

## 2019-08-06 DIAGNOSIS — M79605 Pain in left leg: Secondary | ICD-10-CM

## 2019-08-06 DIAGNOSIS — M25551 Pain in right hip: Secondary | ICD-10-CM

## 2019-08-06 NOTE — Therapy (Signed)
[place] [facility] [address] [place], [address] Phone: [phone]   Fax:  [phone]  Physical Therapy Treatment/Progress Note  Patient Details  Name: Alexis Atkins MRN: [id number] Date of Birth: 1950/08/20 No data recorded Reporting period: 06/24/2019 - 08/06/2019 Encounter Date: 08/06/2019  PT End of Session - 08/06/19 1622    Visit Number  9    Number of Visits  12    Date for PT Re-Evaluation  08/05/19    Authorization Type  9/10    PT Start Time  1345    PT Stop Time  1430    PT Time Calculation (min)  45 min    Activity Tolerance  Patient limited by pain;Patient tolerated treatment well    Behavior During Therapy  [facility] for tasks assessed/performed       Past Medical History:  Diagnosis Date  . Anxiety   . Arthritis   . Cancer [facility]) 2014   breast right side  . Depression   . GERD (gastroesophageal reflux disease)   . Headache    migraines/ approx 2 per week  . Hx of blood clots 12 yrs ago   in lungs, now on coumadin  . Hypertension    controlled on meds  . Hypothyroidism   . 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 Neuropathy    fingers, toes and balls of feet  . Pulmonary embolism ([place])   . Sickle cell trait ([place])   . Vertigo     Past Surgical History:  Procedure Laterality Date  . ARTERY BIOPSY Left 07/18/2016   Procedure: BIOPSY TEMPORAL ARTERY;  Surgeon: Clyde Canterbury, MD;  Location: [place];  Service: ENT;  Laterality: Left;  . BREAST SURGERY Right 2014  . CESAREAN SECTION     x1  . COLONOSCOPY  05/28/2003, 09/12/2007  . COLONOSCOPY WITH PROPOFOL N/A 11/26/2017   Procedure: COLONOSCOPY WITH PROPOFOL;  Surgeon: Manya Silvas, MD;  Location: [facility] ENDOSCOPY;  Service: Endoscopy;  Laterality: N/A;  . DIAGNOSTIC LAPAROSCOPY    . DIAGNOSTIC LAPAROSCOPY    . DILATION AND CURETTAGE OF UTERUS    .  ESOPHAGOGASTRODUODENOSCOPY  05/30/2001  . FOOT SURGERY Bilateral 1984 & 1985  . HAMMER TOE SURGERY Right 10/10/2017   Procedure: HAMMER TOE CORRECTION-2ND AND 3RD TOE and 4TH;  Surgeon: Samara Deist, DPM;  Location: [place];  Service: Podiatry;  Laterality: Right;  IVA LOCAL HAMMERTOE  . KNEE ARTHROSCOPY Right 07/13/06, 05/11/14  . REPLACEMENT TOTAL KNEE Right   . TONSILLECTOMY      There were no vitals filed for this visit.  Subjective Assessment - 08/06/19 1402    Subjective  Patient reports she conttnues to have low back pain but has not had any radiating symptoms down the leg.    Pertinent History  Breast cancer 2015, TKR - 2012    Limitations  Lifting;Standing;Walking;House hold activities    How long can you stand comfortably?  5 minutes    How long can you walk comfortably?  pain comes on immediately    Patient Stated Goals  dancerized, walking    Currently in Pain?  Yes    Pain Score  5     Pain Location  Back    Pain Orientation  Left    Pain Descriptors / Indicators  Aching    Pain Type  Acute pain    Pain Onset  More  than a month ago    Pain Frequency  Intermittent           TREATMENT - UPDATE GOALS Therapeutic Exercise LTRs in hooklying - x 20  Single to chest pulls - 3sec holds - x 10 B Glute sets with bridges - 2 x 10  Glute sets in supine - 2 x 10 with 3 sec holds Lumbar extension in standing with towel - x 15 Ball mobilizations against a wall to decrease spasms and pain - x 45min at the wall Standing glute sets - x 20 Performed exercises to decrease pain and spasms in standing/walking Educated patient on exercises to perform at home and added exercises to HEP   PT Education - 08/06/19 1621    Education Details  POC; form/technique with exercise    Person(s) Educated  Patient    Methods  Explanation;Demonstration    Comprehension  Verbalized understanding;Returned demonstration       PT Short Term Goals - 08/06/19 1623      PT SHORT  TERM GOAL #1   Title  Patient will be independent in her HEP in order to improve her to PLOF    Baseline  Dependent with HEP    Time  2    Period  Weeks    Status  Achieved    Target Date  07/08/19      PT SHORT TERM GOAL #2   Title  Patient will have no sx at baseline and no increase in pain at end of therapy session    Baseline  3/10 pain; 08/06/2019: 1/10 pain    Time  3    Period  Weeks    Status  On-going    Target Date  07/15/19      PT SHORT TERM GOAL #3   Title  Patient will be able to properly demonstrate bed mobility to help limit increase in pain w/ transitions    Baseline  independent with bed mobility    Time  2    Period  Weeks    Status  Achieved    Target Date  07/08/19        PT Long Term Goals - 08/06/19 1624      PT LONG TERM GOAL #1   Title  Patient will be able to walk 561ft w/o increase in pain    Baseline  08/06/2019: able to walk >577ft without increase in pain    Time  6    Period  Weeks    Status  Achieved      PT LONG TERM GOAL #2   Title  Patient will be able to squat demonstrating proper body mechanics in order to lift objects pain free    Baseline  08/06/2019: Requires cueing to perform with proper technique    Time  6    Period  Weeks    Status  On-going      PT LONG TERM GOAL #4   Title  Patient will be able to walk 1 mile w/o pain so she can feel comfortable walking around at grocery store    [place] to walk ~ .64miles without increase in pain    Time  12    Period  Weeks    Status  On-going            Plan - 08/06/19 1625    Clinical Impression Statement  Patient is making significant progress towards long term goals, she's able to walk for a longer  period of time without increase in pain, has less radiating symptoms down her leg, and able to perform more functional tasks with less overall pain. Although patient is improving, she continues to have difficulty in walking long distances and also has increased pain along her  glute on the R side. Patient will benefit from further skilled therapy to return to prior level of function.    Examination-Activity Limitations  Stand;Lift    Stability/Clinical Decision Making  Evolving/Moderate complexity    Rehab Potential  Good    PT Frequency  2x / week    PT Duration  6 weeks    PT Treatment/Interventions  ADLs/Self Care Home Management;Cryotherapy;Electrical Stimulation;Iontophoresis 4mg /ml Dexamethasone;Moist Heat;Traction;Gait training;Stair training;Functional mobility training;Therapeutic activities;Therapeutic exercise;Balance training;Neuromuscular re-education;Patient/family education;Manual techniques;Passive range of motion;Dry needling;Joint Manipulations    PT Next Visit Plan  lumbar flexion, MT, rotational mobs, LE nerve mobilization, prone press ups, pregait training    PT Home Exercise Plan  sidleying trunk rot to R in L sidelying, seated pelvic tilts, standing extensions, Standing SNAGs    Consulted and Agree with Plan of Care  Patient       Patient will benefit from skilled therapeutic intervention in order to improve the following deficits and impairments:  Decreased activity tolerance, Decreased endurance, Difficulty walking, Decreased range of motion, Abnormal gait, Pain, Decreased balance, Hypomobility, Improper body mechanics, Decreased strength  Visit Diagnosis: Pain in left leg  Pain in right hip     Problem List There are no problems to display for this patient.   Blythe Stanford, PT DPT 08/06/2019, 4:28 PM  [place] [facility] [address] [place], [address] Phone: [phone]   Fax:  [phone]  Name: Alexis Atkins MRN: [id number] Date of Birth: 1951/03/22

## 2019-08-27 ENCOUNTER — Ambulatory Visit: Payer: Medicare Other

## 2019-12-18 ENCOUNTER — Other Ambulatory Visit: Payer: Self-pay | Admitting: Internal Medicine

## 2019-12-18 DIAGNOSIS — R1013 Epigastric pain: Secondary | ICD-10-CM

## 2019-12-22 ENCOUNTER — Other Ambulatory Visit: Payer: Self-pay | Admitting: Sports Medicine

## 2019-12-22 DIAGNOSIS — M545 Low back pain, unspecified: Secondary | ICD-10-CM

## 2019-12-22 DIAGNOSIS — M47816 Spondylosis without myelopathy or radiculopathy, lumbar region: Secondary | ICD-10-CM

## 2019-12-22 DIAGNOSIS — M5137 Other intervertebral disc degeneration, lumbosacral region: Secondary | ICD-10-CM

## 2019-12-29 ENCOUNTER — Other Ambulatory Visit: Payer: Self-pay

## 2019-12-29 ENCOUNTER — Emergency Department
Admission: EM | Admit: 2019-12-29 | Discharge: 2019-12-29 | Disposition: A | Discharge status: Home / Self Care | Payer: Medicare Other | Attending: Emergency Medicine | Admitting: Emergency Medicine

## 2019-12-29 ENCOUNTER — Emergency Department: Payer: Medicare Other

## 2019-12-29 ENCOUNTER — Encounter: Payer: Self-pay | Admitting: Emergency Medicine

## 2019-12-29 ENCOUNTER — Ambulatory Visit
Admission: RE | Admit: 2019-12-29 | Discharge: 2019-12-29 | Disposition: A | Discharge status: Home / Self Care | Payer: Medicare Other | Source: Ambulatory Visit | Attending: Internal Medicine | Admitting: Internal Medicine

## 2019-12-29 DIAGNOSIS — Z79890 Hormone replacement therapy (postmenopausal): Secondary | ICD-10-CM | POA: Diagnosis not present

## 2019-12-29 DIAGNOSIS — Z7901 Long term (current) use of anticoagulants: Secondary | ICD-10-CM | POA: Diagnosis not present

## 2019-12-29 DIAGNOSIS — Z79899 Other long term (current) drug therapy: Secondary | ICD-10-CM | POA: Insufficient documentation

## 2019-12-29 DIAGNOSIS — I10 Essential (primary) hypertension: Secondary | ICD-10-CM | POA: Insufficient documentation

## 2019-12-29 DIAGNOSIS — E039 Hypothyroidism, unspecified: Secondary | ICD-10-CM | POA: Insufficient documentation

## 2019-12-29 DIAGNOSIS — Z86711 Personal history of pulmonary embolism: Secondary | ICD-10-CM | POA: Diagnosis not present

## 2019-12-29 DIAGNOSIS — R1013 Epigastric pain: Secondary | ICD-10-CM | POA: Insufficient documentation

## 2019-12-29 DIAGNOSIS — Z853 Personal history of malignant neoplasm of breast: Secondary | ICD-10-CM | POA: Diagnosis not present

## 2019-12-29 LAB — CBC
HCT: 41.7 % (ref 36.0–46.0)
Hemoglobin: 14.3 g/dL (ref 12.0–15.0)
MCH: 27.2 pg (ref 26.0–34.0)
MCHC: 34.3 g/dL (ref 30.0–36.0)
MCV: 79.3 fL — ABNORMAL LOW (ref 80.0–100.0)
Platelets: 291 10*3/uL (ref 150–400)
RBC: 5.26 MIL/uL — ABNORMAL HIGH (ref 3.87–5.11)
RDW: 15.1 % (ref 11.5–15.5)
WBC: 7.3 10*3/uL (ref 4.0–10.5)
nRBC: 0 % (ref 0.0–0.2)

## 2019-12-29 LAB — COMPREHENSIVE METABOLIC PANEL
ALT: 32 U/L (ref 0–44)
AST: 27 U/L (ref 15–41)
Albumin: 4.4 g/dL (ref 3.5–5.0)
Alkaline Phosphatase: 105 U/L (ref 38–126)
Anion gap: 9 (ref 5–15)
BUN: 22 mg/dL (ref 8–23)
CO2: 33 mmol/L — ABNORMAL HIGH (ref 22–32)
Calcium: 9.8 mg/dL (ref 8.9–10.3)
Chloride: 100 mmol/L (ref 98–111)
Creatinine, Ser: 1.33 mg/dL — ABNORMAL HIGH (ref 0.44–1.00)
GFR calc Af Amer: 47 mL/min — ABNORMAL LOW (ref >60)
GFR calc non Af Amer: 41 mL/min — ABNORMAL LOW (ref >60)
Glucose, Bld: 106 mg/dL — ABNORMAL HIGH (ref 70–99)
Potassium: 3.5 mmol/L (ref 3.5–5.1)
Sodium: 142 mmol/L (ref 135–145)
Total Bilirubin: 0.9 mg/dL (ref 0.3–1.2)
Total Protein: 7.7 g/dL (ref 6.5–8.1)

## 2019-12-29 LAB — URINALYSIS, COMPLETE (UACMP) WITH MICROSCOPIC
Bacteria, UA: NONE SEEN
Bilirubin Urine: NEGATIVE
Glucose, UA: NEGATIVE mg/dL
Hgb urine dipstick: NEGATIVE
Ketones, ur: NEGATIVE mg/dL
Leukocytes,Ua: NEGATIVE
Nitrite: NEGATIVE
Protein, ur: NEGATIVE mg/dL
Specific Gravity, Urine: 1.004 — ABNORMAL LOW (ref 1.005–1.030)
Squamous Epithelial / LPF: NONE SEEN (ref 0–5)
pH: 8 (ref 5.0–8.0)

## 2019-12-29 LAB — LIPASE, BLOOD: Lipase: 37 U/L (ref 11–51)

## 2019-12-29 LAB — PROTIME-INR
INR: 2.1 — ABNORMAL HIGH (ref 0.8–1.2)
Prothrombin Time: 22.7 seconds — ABNORMAL HIGH (ref 11.4–15.2)

## 2019-12-29 LAB — TROPONIN I (HIGH SENSITIVITY): Troponin I (High Sensitivity): 5 ng/L (ref <18)

## 2019-12-29 MED ORDER — IOHEXOL 350 MG/ML SOLN
100.0000 mL | Freq: Once | INTRAVENOUS | Status: AC | PRN
  Administered 2019-12-29: 100 mL via INTRAVENOUS
  Filled 2019-12-29: qty 100

## 2019-12-29 MED ORDER — LIDOCAINE VISCOUS HCL 2 % MT SOLN
15.0000 mL | Freq: Once | OROMUCOSAL | Status: AC
  Administered 2019-12-29: 15 mL via ORAL
  Filled 2019-12-29: qty 15

## 2019-12-29 MED ORDER — ALUM & MAG HYDROXIDE-SIMETH 200-200-20 MG/5ML PO SUSP
30.0000 mL | Freq: Once | ORAL | Status: AC
  Administered 2019-12-29: 30 mL via ORAL
  Filled 2019-12-29: qty 30

## 2019-12-29 MED ORDER — DROPERIDOL 2.5 MG/ML IJ SOLN
2.5000 mg | Freq: Once | INTRAMUSCULAR | Status: AC
Start: 2019-12-29 — End: 2019-12-29
  Administered 2019-12-29: 2.5 mg via INTRAVENOUS
  Filled 2019-12-29: qty 2

## 2019-12-29 NOTE — ED Provider Notes (Signed)
[facility] Emergency Department Provider Note ____________________________________________   First MD Initiated Contact with Patient 12/29/19 1617     (approximate)  I have reviewed the triage vital signs and the nursing notes.  HISTORY  Chief Complaint Abdominal Pain   HPI Alexis Atkins is a 69 y.o. female for evaluation of subacute abdominal pain.  Chart review indicates history of HTN, hypothyroidism, depression, breast cancer.  Outpatient PCP note from 7/2 indicates visit for 1 week of epigastric pain.  Patient was started on Protonix at that time.  Additionally, patient was scheduled for an outpatient complete abdominal ultrasound, which was performed earlier today.  I have reviewed the study and only demonstrates hepatic steatosis, which is not new for the patient considering 2017 CT imaging demonstrating similar appearance of her liver.  Otherwise this ultrasound study does not show evidence of acute pathology.  Patient reports 3 weeks of a "tight band across my upper abdomen."  She reports this pain was not changed by initiation of Protonix, and remained unchanged for a matter of weeks, until worsening for the past 3-4 days.  She reports a similar quality of pain, tightness, but reports the intensity have increased from 4/10 intensity-8/10 intensity at this time.  She reports the pain has been constant, in the same location, nonradiating for the past 3 days.  Unrelieved by Protonix, position changes.   Patient denies any radiation of the pain.  Denies chest pain, left arm pain, shortness of breath, orthopnea, lower extremity edema, vomiting, nausea, diarrhea.  She reports tolerating p.o. intake and toileting at her baseline.  Denies fevers.  Patient is on Coumadin for previous PE.  Last level was checked 2 and half weeks ago, noted to be high and she skipped 1 dose and resumed course.  Has not had an INR checked since 7/14.  Past Medical History:   Diagnosis Date  . Anxiety   . Arthritis   . Cancer [facility]) 2014   breast right side  . Depression   . GERD (gastroesophageal reflux disease)   . Headache    migraines/ approx 2 per week  . Hx of blood clots 12 yrs ago   in lungs, now on coumadin  . Hypertension    controlled on meds  . Hypothyroidism   . 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 Neuropathy    fingers, toes and balls of feet  . Pulmonary embolism ([place])   . Sickle cell trait ([place])   . Vertigo     There are no problems to display for this patient.   Past Surgical History:  Procedure Laterality Date  . ARTERY BIOPSY Left 07/18/2016   Procedure: BIOPSY TEMPORAL ARTERY;  Surgeon: Clyde Canterbury, MD;  Location: [place];  Service: ENT;  Laterality: Left;  . BREAST SURGERY Right 2014  . CESAREAN SECTION     x1  . COLONOSCOPY  05/28/2003, 09/12/2007  . COLONOSCOPY WITH PROPOFOL N/A 11/26/2017   Procedure: COLONOSCOPY WITH PROPOFOL;  Surgeon: Manya Silvas, MD;  Location: [place] [facility];  Service: Endoscopy;  Laterality: N/A;  . DIAGNOSTIC LAPAROSCOPY    . DIAGNOSTIC LAPAROSCOPY    . DILATION AND CURETTAGE OF UTERUS    . ESOPHAGOGASTRODUODENOSCOPY  05/30/2001  . FOOT SURGERY Bilateral 1984 & 1985  . HAMMER TOE SURGERY Right 10/10/2017   Procedure: HAMMER TOE CORRECTION-2ND AND 3RD TOE and 4TH;  Surgeon: Samara Deist, DPM;  Location: [place];  Service: Podiatry;  Laterality: Right;  IVA LOCAL HAMMERTOE  . KNEE ARTHROSCOPY Right 07/13/06, 05/11/14  . REPLACEMENT TOTAL KNEE Right   . TONSILLECTOMY      Prior to Admission medications   Medication Sig Start Date End Date Taking? Authorizing Provider  acetaminophen (TYLENOL) 500 MG tablet Take 500 mg by mouth every 6 (six) hours as needed.    [provider]  benzonatate (TESSALON) 200 MG capsule Take 1 capsule (200 mg total) by mouth 3 (three) times daily as needed for cough.  05/16/18   Melynda Ripple, MD  carvedilol (COREG) 6.25 MG tablet Take 6.25 mg by mouth 2 (two) times daily with a meal.    [provider]  chlorpheniramine-HYDROcodone (TUSSIONEX PENNKINETIC ER) 10-8 MG/5ML SUER Take 5 mLs by mouth every 12 (twelve) hours as needed for cough. 05/16/18   Melynda Ripple, MD  Cholecalciferol (VITAMIN D3 PO) Take 600 mg by mouth 2 (two) times daily.    [provider]  Cyanocobalamin (VITAMIN B-12) 500 MCG LOZG Take 500 mcg by mouth daily. am     [provider]  DULoxetine (CYMBALTA) 20 MG capsule Take 20 mg by mouth daily.    [provider]  letrozole (FEMARA) 2.5 MG tablet Take 2.5 mg by mouth daily. pm    [provider]  levothyroxine (SYNTHROID, LEVOTHROID) 175 MCG tablet Take 175 mcg by mouth daily before breakfast. Am     [provider]  magnesium oxide (MAG-OX) 400 MG tablet Take 800 mg by mouth daily.    [provider]  meclizine (ANTIVERT) 25 MG tablet Take 1 tablet (25 mg total) by mouth 3 (three) times daily as needed for dizziness. 02/02/17   Harvest Dark, MD  Multiple Vitamin (MULTIVITAMIN) tablet Take 1 tablet by mouth daily.    [provider]  ondansetron (ZOFRAN ODT) 4 MG disintegrating tablet Take 1 tablet (4 mg total) by mouth every 8 (eight) hours as needed for nausea or vomiting. 02/02/17   Harvest Dark, MD  oseltamivir (TAMIFLU) 75 MG capsule Take 1 capsule (75 mg total) by mouth 2 (two) times daily. X 5 days 05/16/18   Melynda Ripple, MD  potassium chloride (KLOR-CON) 20 MEQ packet Take by mouth 2 (two) times daily. Am and pm    [provider]  torsemide (DEMADEX) 20 MG tablet Take 20 mg by mouth daily. am    [provider]  warfarin (COUMADIN) 6 MG tablet Take 6 mg by mouth daily. Pm    [provider]    Allergies Asa [aspirin], Celebrex [celecoxib], and Codeine  No family history on file.  Social History Social  History   Tobacco Use  . Smoking status: Never Smoker  . Smokeless tobacco: Never Used  Vaping Use  . Vaping Use: Never used  Substance Use Topics  . Alcohol use: No  . Drug use: Never    Review of Systems  Constitutional: No fever/chills Eyes: No visual changes. ENT: No sore throat. Cardiovascular: Denies chest pain. Respiratory: Denies shortness of breath. Gastrointestinal: Positive for isolated epigastric abdominal pain.  No nausea, no vomiting.  No diarrhea.  No constipation. Genitourinary: Negative for dysuria. Musculoskeletal: Negative for back pain. Skin: Negative for rash. Neurological: Negative for headaches, focal weakness or numbness.   ____________________________________________   PHYSICAL EXAM:  VITAL SIGNS: Vitals:   12/29/19 1413  BP: (!) 135/102  Pulse: 82  Resp: 18  Temp: 99 F (37.2 C)  SpO2: 97%  Constitutional: Alert and oriented. Well appearing and in no acute distress. Eyes: Conjunctivae are normal. PERRL. EOMI. Head: Atraumatic. Nose: No congestion/rhinnorhea. Mouth/Throat: Mucous membranes are moist.  Oropharynx non-erythematous. Neck: No stridor. No cervical spine tenderness to palpation. Cardiovascular: Normal rate, regular rhythm. Grossly normal heart sounds.  Good peripheral circulation. Respiratory: Normal respiratory effort.  No retractions. Lungs CTAB. Gastrointestinal: Soft , nondistended. No abdominal bruits. No CVA tenderness. Soft and benign abdomen throughout, though with epigastric palpation patient grimaces and reports severe pain.  No guarding, rebound or peritoneal features. Musculoskeletal: No lower extremity tenderness nor edema.  No joint effusions. No signs of acute trauma. Neurologic:  Normal speech and language. No gross focal neurologic deficits are appreciated. No gait instability noted. Skin:  Skin is warm, dry and intact. No rash noted. Psychiatric: Mood and affect are normal. Speech and behavior are  normal.  ____________________________________________   LABS (all labs ordered are listed, but only abnormal results are displayed)  Labs Reviewed  COMPREHENSIVE METABOLIC PANEL - Abnormal; Notable for the following components:      Result Value   CO2 33 (*)    Glucose, Bld 106 (*)    Creatinine, Ser 1.33 (*)    GFR calc non Af Amer 41 (*)    GFR calc Af Amer 47 (*)    All other components within normal limits  CBC - Abnormal; Notable for the following components:   RBC 5.26 (*)    MCV 79.3 (*)    All other components within normal limits  URINALYSIS, COMPLETE (UACMP) WITH MICROSCOPIC - Abnormal; Notable for the following components:   Color, Urine STRAW (*)    APPearance CLEAR (*)    Specific Gravity, Urine 1.004 (*)    All other components within normal limits  PROTIME-INR - Abnormal; Notable for the following components:   Prothrombin Time 22.7 (*)    INR 2.1 (*)    All other components within normal limits  LIPASE, BLOOD  TROPONIN I (HIGH SENSITIVITY)   ____________________________________________  12 Lead EKG  Sinus rhythm, 81 bpm, normal axis and intervals.  T wave inversions isolated to lead III and aVF, new from previous.  No evidence otherwise of acute ischemia. ____________________________________________  RADIOLOGY  ED MD interpretation: 2 view CXR without evidence of acute cardiopulmonary pathology  Official radiology report(s): DG Chest 2 View  Result Date: 12/29/2019 CLINICAL DATA:  69 year old female with epigastric pain. EXAM: CHEST - 2 VIEW COMPARISON:  Chest radiograph dated 05/16/2018. FINDINGS: No focal consolidation, pleural effusion, or pneumothorax. The cardiac silhouette is within limits. No acute osseous pathology. IMPRESSION: No active cardiopulmonary disease. Electronically Signed   By: Anner Crete M.D.   On: 12/29/2019 17:28   US Abdomen Complete  Result Date: 12/29/2019 CLINICAL DATA:  Epigastric pain EXAM: ABDOMEN ULTRASOUND COMPLETE  COMPARISON:  None. FINDINGS: Gallbladder: No gallstones or wall thickening visualized. No sonographic Murphy sign noted by sonographer. Common bile duct: Diameter: 3 mm, normal Liver: No focal lesion identified. Increased parenchymal echogenicity. Portal vein is patent on color Doppler imaging with normal direction of blood flow towards the liver. IVC: No abnormality visualized. Pancreas: Obscured. Spleen: Size and appearance within normal limits. Right Kidney: Length: 10.8. Echogenicity within normal limits. No mass or hydronephrosis visualized. Left Kidney: Length: 10.9. Echogenicity within normal limits. No mass or hydronephrosis visualized. Abdominal aorta: No aneurysm visualized but suboptimally evaluated. Other findings: None. IMPRESSION: Suboptimal evaluation due to body habitus and bowel gas. Hepatic steatosis. Electronically Signed   By: Malachi Carl  Patel M.D.   On: 12/29/2019 09:24   CT Angio Abd/Pel w/ and/or w/o  Result Date: 12/29/2019 CLINICAL DATA:  Abdominal pain. History of breast cancer in prior Caesarean section. EXAM: CTA ABDOMEN AND PELVIS WITHOUT AND WITH CONTRAST TECHNIQUE: Multidetector CT imaging of the abdomen and pelvis was performed using the standard protocol during bolus administration of intravenous contrast. Multiplanar reconstructed images and MIPs were obtained and reviewed to evaluate the vascular anatomy. CONTRAST:  169mL OMNIPAQUE IOHEXOL 350 MG/ML SOLN COMPARISON:  07/19/2015 FINDINGS: VASCULAR Aorta: Normal caliber aorta without aneurysm, dissection, vasculitis or significant stenosis. Celiac: Patent without evidence of aneurysm, dissection, vasculitis or significant stenosis. SMA: Patent without evidence of aneurysm, dissection, vasculitis or significant stenosis. Renals: Both renal arteries are patent without evidence of aneurysm, dissection, vasculitis, fibromuscular dysplasia or significant stenosis. There are 2 renal arteries bilaterally. IMA: Patent without evidence of  aneurysm, dissection, vasculitis or significant stenosis. Inflow: Patent without evidence of aneurysm, dissection, vasculitis or significant stenosis. Proximal Outflow: Bilateral common femoral and visualized portions of the superficial and profunda femoral arteries are patent without evidence of aneurysm, dissection, vasculitis or significant stenosis. Veins: No obvious venous abnormality within the limitations of this arterial phase study. Review of the MIP images confirms the above findings. NON-VASCULAR Lower chest: There is bibasilar airspace disease favored to represent areas of atelectasis.The heart size is normal. Hepatobiliary: There is decreased hepatic attenuation suggestive of hepatic steatosis. Normal gallbladder.There is no biliary ductal dilation. Pancreas: Normal contours without ductal dilatation. No peripancreatic fluid collection. Spleen: Unremarkable. Adrenals/Urinary Tract: --Adrenal glands: Unremarkable. --Right kidney/ureter: No hydronephrosis or radiopaque kidney stones. --Left kidney/ureter: No hydronephrosis or radiopaque kidney stones. --Urinary bladder: Unremarkable. Stomach/Bowel: --Stomach/Duodenum: No hiatal hernia or other gastric abnormality. Normal duodenal course and caliber. --Small bowel: Unremarkable. --Colon: There is scattered colonic diverticula without CT evidence for diverticulitis. --Appendix: Normal. Lymphatic: --No retroperitoneal lymphadenopathy. --No mesenteric lymphadenopathy. --No pelvic or inguinal lymphadenopathy. Reproductive: Unremarkable Other: No ascites or free air. The abdominal wall is normal. Musculoskeletal. No acute displaced fractures. IMPRESSION: 1. No acute abdominal or pelvic pathology. 2. Hepatic steatosis. 3. Colonic diverticulosis without CT evidence for diverticulitis. Electronically Signed   By: Constance Holster M.D.   On: 12/29/2019 18:16    ____________________________________________   PROCEDURES and INTERVENTIONS  Procedure(s)  performed (including Critical Care):  Procedures  Medications  alum & mag hydroxide-simeth (MAALOX/MYLANTA) 200-200-20 MG/5ML suspension 30 mL (30 mLs Oral Given 12/29/19 1736)    And  lidocaine (XYLOCAINE) 2 % viscous mouth solution 15 mL (15 mLs Oral Given 12/29/19 1736)  iohexol (OMNIPAQUE) 350 MG/ML injection 100 mL (100 mLs Intravenous Contrast Given 12/29/19 1744)  droperidol (INAPSINE) 2.5 MG/ML injection 2.5 mg (2.5 mg Intravenous Given 12/29/19 1853)    ____________________________________________   INITIAL IMPRESSION / ASSESSMENT AND PLAN / ED COURSE  69 year old female presenting with subacute epigastric pain without evidence of acute pathology and amenable to outpatient management with PCP and GI follow-up.  Normal vital signs on room air.  Exam with benign abdominal exam, and appears to be pain out of proportion of my examination.  She is otherwise well-appearing without evidence of neurovascular deficits or trauma.  Blood work with slight decrease in her GFR from mild baseline CKD, otherwise without acute derangements.  INR therapeutic.  While her EKG does have new T wave inversions to inferior leads, she does not have symptoms of anginal chest pain or exertional symptoms.  Her high-sensitivity troponin was negative.  This is reassuring that her symptoms are unlikely  to represent ACS or cardiac ischemia.  CXR without evidence of acute cardiopulmonary pathology.  Due to her apparent pain out of proportion, and history of PE, CTA abdomen obtained to assess for mesenteric ischemia, and without evidence of such.  Patient reports improving pain after droperidol administration.  She is otherwise well-appearing and ambulatory throughout the ED to use the restroom.  I see no evidence of acute arrangements to warrant inpatient admission or further work-up here in the ED.  She is outpatient follow-up with GI in the next 2-3 weeks, and is well-established with PCP.  I urged her to follow-up in his  appointments.  We discussed return precautions for the ED, and patient is medically stable for discharge home.  Clinical Course as of Dec 28 1856  [facility] Dec 29, 2019  1817 Reassessed.  Educated patient on reassuring INR, troponin and CXR.  Patient reports continued pain despite the GI cocktail.  Droperidol ordered.   [DS]  1852 Educated patient on reassuring work-up without evidence of acute pathology.  We discussed outpatient management, following up with GI and her PCP.  Answered questions.   [DS]    Clinical Course User Index [DS] Vladimir Crofts, MD     ____________________________________________   FINAL CLINICAL IMPRESSION(S) / ED DIAGNOSES  Final diagnoses:  Epigastric pain  Anticoagulated     ED Discharge Orders    None       Kaladin Noseworthy Tamala Julian   Note:  This document was prepared using Dragon voice recognition software and may include unintentional dictation errors.   Vladimir Crofts, MD 12/29/19 1901

## 2019-12-29 NOTE — ED Notes (Signed)
Pt presents to the ED for upper abdominal pain on both side. Denies NVD or constipation. Pt states this have been going on for 3 weeks. Pt is A&Ox4 and NAD at this time

## 2019-12-29 NOTE — ED Triage Notes (Signed)
Patient to ER for c/o upper bilateral abd pain that patient describes as heaviness x3 weeks. Patient had ultrasound completed this am that is available in results. Patient denies N/V/D or fever.

## 2019-12-29 NOTE — Discharge Instructions (Signed)
You were seen in the ED because of your upper abdominal pain.  We did blood work, heart testing, chest x-ray and CT scan of your abdomen. Thankfully, there is no evidence of any new problems with these tests, and there is no signs of life-threatening conditions causing her pain.  Please continue your normal care at home, taking all of your prescribed medications, and follow-up with your PCP as scheduled.  Return to the ED with any associated fevers, vomiting or chest pain.

## 2020-01-07 ENCOUNTER — Other Ambulatory Visit
Admission: RE | Admit: 2020-01-07 | Discharge: 2020-01-07 | Disposition: A | Discharge status: Home / Self Care | Payer: Medicare Other | Source: Ambulatory Visit | Attending: Gastroenterology | Admitting: Gastroenterology

## 2020-01-07 ENCOUNTER — Other Ambulatory Visit: Payer: Self-pay

## 2020-01-07 DIAGNOSIS — Z01812 Encounter for preprocedural laboratory examination: Secondary | ICD-10-CM | POA: Diagnosis present

## 2020-01-07 DIAGNOSIS — Z20822 Contact with and (suspected) exposure to covid-19: Secondary | ICD-10-CM | POA: Diagnosis not present

## 2020-01-07 LAB — SARS CORONAVIRUS 2 (TAT 6-24 HRS): SARS Coronavirus 2: NEGATIVE

## 2020-01-08 ENCOUNTER — Encounter: Payer: Self-pay | Admitting: *Deleted

## 2020-01-08 ENCOUNTER — Ambulatory Visit: Payer: PRIVATE HEALTH INSURANCE

## 2020-01-09 ENCOUNTER — Encounter
Admission: RE | Disposition: A | Discharge status: Home / Self Care | Payer: Self-pay | Source: Home / Self Care | Attending: Gastroenterology

## 2020-01-09 ENCOUNTER — Ambulatory Visit: Payer: Medicare Other | Admitting: Certified Registered Nurse Anesthetist

## 2020-01-09 ENCOUNTER — Ambulatory Visit
Admission: RE | Admit: 2020-01-09 | Discharge: 2020-01-09 | Disposition: A | Discharge status: Home / Self Care | Payer: Medicare Other | Attending: Gastroenterology | Admitting: Gastroenterology

## 2020-01-09 ENCOUNTER — Encounter: Payer: Self-pay | Admitting: *Deleted

## 2020-01-09 DIAGNOSIS — Z86711 Personal history of pulmonary embolism: Secondary | ICD-10-CM | POA: Insufficient documentation

## 2020-01-09 DIAGNOSIS — G629 Polyneuropathy, unspecified: Secondary | ICD-10-CM | POA: Insufficient documentation

## 2020-01-09 DIAGNOSIS — Z79890 Hormone replacement therapy (postmenopausal): Secondary | ICD-10-CM | POA: Insufficient documentation

## 2020-01-09 DIAGNOSIS — M1990 Unspecified osteoarthritis, unspecified site: Secondary | ICD-10-CM | POA: Diagnosis not present

## 2020-01-09 DIAGNOSIS — Z853 Personal history of malignant neoplasm of breast: Secondary | ICD-10-CM | POA: Insufficient documentation

## 2020-01-09 DIAGNOSIS — F329 Major depressive disorder, single episode, unspecified: Secondary | ICD-10-CM | POA: Diagnosis not present

## 2020-01-09 DIAGNOSIS — R1010 Upper abdominal pain, unspecified: Secondary | ICD-10-CM | POA: Diagnosis present

## 2020-01-09 DIAGNOSIS — Z79899 Other long term (current) drug therapy: Secondary | ICD-10-CM | POA: Insufficient documentation

## 2020-01-09 DIAGNOSIS — K219 Gastro-esophageal reflux disease without esophagitis: Secondary | ICD-10-CM | POA: Insufficient documentation

## 2020-01-09 DIAGNOSIS — D573 Sickle-cell trait: Secondary | ICD-10-CM | POA: Diagnosis not present

## 2020-01-09 DIAGNOSIS — Z7901 Long term (current) use of anticoagulants: Secondary | ICD-10-CM | POA: Insufficient documentation

## 2020-01-09 DIAGNOSIS — E039 Hypothyroidism, unspecified: Secondary | ICD-10-CM | POA: Insufficient documentation

## 2020-01-09 DIAGNOSIS — F419 Anxiety disorder, unspecified: Secondary | ICD-10-CM | POA: Insufficient documentation

## 2020-01-09 DIAGNOSIS — I10 Essential (primary) hypertension: Secondary | ICD-10-CM | POA: Diagnosis not present

## 2020-01-09 HISTORY — PX: ESOPHAGOGASTRODUODENOSCOPY (EGD) WITH PROPOFOL: SHX5813

## 2020-01-09 HISTORY — DX: Anemia, unspecified: D64.9

## 2020-01-09 SURGERY — ESOPHAGOGASTRODUODENOSCOPY (EGD) WITH PROPOFOL
Anesthesia: General

## 2020-01-09 MED ORDER — LIDOCAINE HCL (CARDIAC) PF 100 MG/5ML IV SOSY
PREFILLED_SYRINGE | INTRAVENOUS | Status: DC | PRN
  Administered 2020-01-09: 30 mg via INTRAVENOUS

## 2020-01-09 MED ORDER — PROPOFOL 500 MG/50ML IV EMUL
INTRAVENOUS | Status: DC | PRN
  Administered 2020-01-09: 100 ug/kg/min via INTRAVENOUS

## 2020-01-09 MED ORDER — LIDOCAINE HCL (PF) 1 % IJ SOLN
INTRAMUSCULAR | Status: AC
  Filled 2020-01-09: qty 2

## 2020-01-09 MED ORDER — PROPOFOL 10 MG/ML IV BOLUS
INTRAVENOUS | Status: DC | PRN
  Administered 2020-01-09: 50 mg via INTRAVENOUS

## 2020-01-09 MED ORDER — SODIUM CHLORIDE 0.9 % IV SOLN
INTRAVENOUS | Status: DC
  Administered 2020-01-09: 1000 mL via INTRAVENOUS

## 2020-01-09 NOTE — Anesthesia Postprocedure Evaluation (Signed)
Anesthesia Post Note  Patient: MERCADEZ HEITMAN  Procedure(s) Performed: ESOPHAGOGASTRODUODENOSCOPY (EGD) WITH PROPOFOL (N/A )  Patient location during evaluation: Endoscopy Anesthesia Type: General Level of consciousness: awake and alert and oriented Pain management: pain level controlled Vital Signs Assessment: post-procedure vital signs reviewed and stable Respiratory status: spontaneous breathing, nonlabored ventilation and respiratory function stable Cardiovascular status: blood pressure returned to baseline and stable Postop Assessment: no signs of nausea or vomiting Anesthetic complications: no   No complications documented.   Last Vitals:  Vitals:   01/09/20 1018 01/09/20 1019  BP: (!) 154/79   Pulse:  76  Resp:    Temp:    SpO2:  98%    Last Pain:  Vitals:   01/09/20 1019  TempSrc:   PainSc: 0-No pain                 Joaopedro Eschbach

## 2020-01-09 NOTE — Interval H&P Note (Signed)
History and Physical Interval Note:  01/09/2020 9:29 AM  Alexis Atkins  has presented today for surgery, with the diagnosis of BIL LOWER ABD PAIN.  The various methods of treatment have been discussed with the patient and family. After consideration of risks, benefits and other options for treatment, the patient has consented to  Procedure(s): ESOPHAGOGASTRODUODENOSCOPY (EGD) WITH PROPOFOL (N/A) as a surgical intervention.  The patient's history has been reviewed, patient examined, no change in status, stable for surgery.  I have reviewed the patient's chart and labs.  Questions were answered to the patient's satisfaction.     [name]  Ok to proceed with EGD.

## 2020-01-09 NOTE — Op Note (Addendum)
[facility] Gastroenterology Patient Name: Alexis Atkins Procedure Date: 01/09/2020 9:37 AM MRN: [id number] Account #: [id number] Date of Birth: April 01, 1951 Admit Type: Outpatient Age: 69 Room: [facility] ENDO ROOM 3 Gender: Female Note Status: Finalized Procedure:             Upper GI endoscopy Indications:           Upper abdominal pain Providers:             Andrey Farmer MD, MD Referring MD:          Ocie Cornfield. Ouida Sills MD, MD (Referring MD) Medicines:             Monitored Anesthesia Care Complications:         No immediate complications. Procedure:             Pre-Anesthesia Assessment:                        - Prior to the procedure, a History and Physical was                         performed, and patient medications and allergies were                         reviewed. The patient is competent. The risks and                         benefits of the procedure and the sedation options and                         risks were discussed with the patient. All questions                         were answered and informed consent was obtained.                         Patient identification and proposed procedure were                         verified by the physician, the nurse, the anesthetist                         and the technician in the endoscopy suite. Mental                         Status Examination: alert and oriented. Airway                         Examination: normal oropharyngeal airway and neck                         mobility. Respiratory Examination: clear to                         auscultation. CV Examination: normal. Prophylactic                         Antibiotics: The patient does not require prophylactic  antibiotics. Prior Anticoagulants: The patient has                         taken Coumadin (warfarin), last dose was 5 days prior                         to procedure. ASA Grade Assessment: III - A patient                          with severe systemic disease. After reviewing the                         risks and benefits, the patient was deemed in                         satisfactory condition to undergo the procedure. The                         anesthesia plan was to use monitored anesthesia care                         (MAC). Immediately prior to administration of                         medications, the patient was re-assessed for adequacy                         to receive sedatives. The heart rate, respiratory                         rate, oxygen saturations, blood pressure, adequacy of                         pulmonary ventilation, and response to care were                         monitored throughout the procedure. The physical                         status of the patient was re-assessed after the                         procedure.                        After obtaining informed consent, the endoscope was                         passed under direct vision. Throughout the procedure,                         the patient's blood pressure, pulse, and oxygen                         saturations were monitored continuously. The Endoscope                         was introduced through the mouth, with the intention  of advancing to the stomach. The scope was advanced to                         the gastric body before the procedure was aborted.                         Medications were given. The upper GI endoscopy was                         technically difficult and complex due to presence of                         food. The patient tolerated the procedure well. Findings:      The examined esophagus was normal.      A large amount of food (residue) was found in the entire examined       stomach. Impression:            - Normal esophagus.                        - A large amount of food (residue) in the stomach.                        - No specimens collected. Recommendation:        -  Discharge patient to home.                        - Resume previous diet.                        - Continue present medications.                        - Repeat upper endoscopy at the next available                         appointment because the preparation was poor.                        - Return to referring physician as previously                         scheduled.                        - Resume Coumadin (warfarin) at prior dose today.                         Refer to managing physician for further adjustment of                         therapy. Procedure Code(s):     --- Professional ---                        [phone], 52, Esophagogastroduodenoscopy, flexible,                         transoral; diagnostic, including collection of  specimen(s) by brushing or washing, when performed                         (separate procedure) Diagnosis Code(s):     --- Professional ---                        R10.10, Upper abdominal pain, unspecified CPT copyright 2019 [employer]. All rights reserved. The codes documented in this report are preliminary and upon coder review may  be revised to meet current compliance requirements. Andrey Farmer, MD Andrey Farmer MD, MD 01/09/2020 9:48:48 AM Number of Addenda: 0 Note Initiated On: 01/09/2020 9:37 AM Estimated Blood Loss:  Estimated blood loss: none.      [facility]

## 2020-01-09 NOTE — H&P (Signed)
Outpatient short stay form Pre-procedure 01/09/2020 9:26 AM Alexis Miyamoto MD, MPH  Primary Physician: Dr. Ouida Sills  Reason for visit:  Abdominal Pain  History of present illness:   69 y/o lady with history of abdominal pain with unrevealing work-up so far. On coumadin with last dose 5 days ago. No family history of GI malignancies.    Current Facility-Administered Medications:  .  0.9 %  sodium chloride infusion, , Intravenous, Continuous, Kashawn Dirr, Lysbeth Galas T, MD .  lidocaine (PF) (XYLOCAINE) 1 % injection, , , ,   Medications Prior to Admission  Medication Sig Dispense Refill Last Dose  . Calcium Carbonate-Vitamin D (CALTRATE 600+D PO) Take by mouth 2 (two) times daily with a meal.     . DULoxetine (CYMBALTA) 60 MG capsule Take 60 mg by mouth daily.     Marland Kitchen gabapentin (NEURONTIN) 300 MG capsule Take 300 mg by mouth 2 (two) times daily.     . pantoprazole (PROTONIX) 40 MG tablet Take 40 mg by mouth daily.     . sucralfate (CARAFATE) 1 g tablet Take 1 g by mouth 4 (four) times daily. 4x daily before meals and nightly     . traZODone (DESYREL) 100 MG tablet Take 100 mg by mouth at bedtime.     Marland Kitchen acetaminophen (TYLENOL) 500 MG tablet Take 500 mg by mouth every 6 (six) hours as needed.     . benzonatate (TESSALON) 200 MG capsule Take 1 capsule (200 mg total) by mouth 3 (three) times daily as needed for cough. 30 capsule 0   . carvedilol (COREG) 6.25 MG tablet Take 6.25 mg by mouth 2 (two) times daily with a meal.   01/08/2020  . chlorpheniramine-HYDROcodone (TUSSIONEX PENNKINETIC ER) 10-8 MG/5ML SUER Take 5 mLs by mouth every 12 (twelve) hours as needed for cough. 60 mL 0   . Cholecalciferol (VITAMIN D3 PO) Take 600 mg by mouth 2 (two) times daily.     . Cyanocobalamin (VITAMIN B-12) 500 MCG LOZG Take 500 mcg by mouth daily. am      . DULoxetine (CYMBALTA) 20 MG capsule Take 20 mg by mouth daily.     Marland Kitchen letrozole (FEMARA) 2.5 MG tablet Take 2.5 mg by mouth daily. pm     . levothyroxine  (SYNTHROID, LEVOTHROID) 175 MCG tablet Take 175 mcg by mouth daily before breakfast. Am      . magnesium oxide (MAG-OX) 400 MG tablet Take 800 mg by mouth daily.     . meclizine (ANTIVERT) 25 MG tablet Take 1 tablet (25 mg total) by mouth 3 (three) times daily as needed for dizziness. 30 tablet 0   . Multiple Vitamin (MULTIVITAMIN) tablet Take 1 tablet by mouth daily.     . ondansetron (ZOFRAN ODT) 4 MG disintegrating tablet Take 1 tablet (4 mg total) by mouth every 8 (eight) hours as needed for nausea or vomiting. 20 tablet 0   . oseltamivir (TAMIFLU) 75 MG capsule Take 1 capsule (75 mg total) by mouth 2 (two) times daily. X 5 days 10 capsule 0   . potassium chloride (KLOR-CON) 20 MEQ packet Take by mouth 2 (two) times daily. Am and pm     . torsemide (DEMADEX) 20 MG tablet Take 20 mg by mouth daily. am     . warfarin (COUMADIN) 6 MG tablet Take 6 mg by mouth daily. Pm   01/03/2020     Allergies  Allergen Reactions  . Asa [Aspirin]     Sickle cell trait/ told not to take  .  Celebrex [Celecoxib] Swelling  . Codeine Nausea And Vomiting  . Oxycodone Itching     Past Medical History:  Diagnosis Date  . Anemia    pernicious anemia  . Anxiety   . Arthritis   . Cancer [facility]) 2014   breast right side  . Depression   . GERD (gastroesophageal reflux disease)   . Headache    migraines/ approx 2 per week  . Hx of blood clots 12 yrs ago   in lungs, now on coumadin  . Hypertension    controlled on meds  . Hypothyroidism   . 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 Neuropathy    fingers, toes and balls of feet  . Pulmonary embolism ([place])   . Sickle cell trait ([place])   . Vertigo     Review of systems:  Otherwise negative.    Physical Exam  Gen: Alert, oriented. Appears stated age.  HEENT: [place]/AT. PERRLA. Lungs: No respiratory distress Abd: soft, benign, no masses. BS+ Ext: No edema. Pulses 2+    Planned procedures: Proceed  with EGD. The patient understands the nature of the planned procedure, indications, risks, alternatives and potential complications including but not limited to bleeding, infection, perforation, damage to internal organs and possible oversedation/side effects from anesthesia. The patient agrees and gives consent to proceed.  Please refer to procedure notes for findings, recommendations and patient disposition/instructions.     Alexis Miyamoto MD, MPH Gastroenterology 01/09/2020  9:26 AM

## 2020-01-09 NOTE — Anesthesia Preprocedure Evaluation (Signed)
Anesthesia Evaluation  Patient identified by MRN, date of birth, ID band Patient awake    Reviewed: Allergy & Precautions, NPO status , Patient's Chart, lab work & pertinent test results  History of Anesthesia Complications Negative for: history of anesthetic complications  Airway Mallampati: II  TM Distance: >3 FB Neck ROM: Full    Dental no notable dental hx.    Pulmonary neg pulmonary ROS, neg sleep apnea, neg COPD,    breath sounds clear to auscultation- rhonchi (-) wheezing      Cardiovascular hypertension, Pt. on medications (-) CAD, (-) Past MI, (-) Cardiac Stents and (-) CABG  Rhythm:Regular Rate:Normal - Systolic murmurs and - Diastolic murmurs    Neuro/Psych  Headaches, neg Seizures PSYCHIATRIC DISORDERS Anxiety Depression    GI/Hepatic Neg liver ROS, GERD  ,  Endo/Other  neg diabetesHypothyroidism   Renal/GU CRFRenal disease     Musculoskeletal  (+) Arthritis ,   Abdominal (+) - obese,   Peds  Hematology  (+) anemia ,   Anesthesia Other Findings Past Medical History: No date: Anemia     Comment:  pernicious anemia No date: Anxiety No date: Arthritis 2014: Cancer ([place])     Comment:  breast right side No date: Depression No date: GERD (gastroesophageal reflux disease) No date: Headache     Comment:  migraines/ approx 2 per week 12 yrs ago: Hx of blood clots     Comment:  in lungs, now on coumadin No date: Hypertension     Comment:  controlled on meds No date: Hypothyroidism No date: Neuromuscular disorder (HCC)     Comment:  neuropathy in hands and feet, radiation from breast               cancer No date: Neuropathy     Comment:  fingers, toes and balls of feet No date: Pulmonary embolism (HCC) No date: Sickle cell trait (HCC) No date: Vertigo   Reproductive/Obstetrics                             Anesthesia Physical Anesthesia Plan  ASA: III  Anesthesia Plan:  General   Post-op Pain Management:    Induction: Intravenous  PONV Risk Score and Plan: 2 and Propofol infusion  Airway Management Planned: Natural Airway  Additional Equipment:   Intra-op Plan:   Post-operative Plan:   Informed Consent: I have reviewed the patients History and Physical, chart, labs and discussed the procedure including the risks, benefits and alternatives for the proposed anesthesia with the patient or authorized representative who has indicated his/her understanding and acceptance.     Dental advisory given  Plan Discussed with: CRNA and Anesthesiologist  Anesthesia Plan Comments:         Anesthesia Quick Evaluation

## 2020-01-09 NOTE — Transfer of Care (Signed)
Immediate Anesthesia Transfer of Care Note  Patient: Alexis Atkins  Procedure(s) Performed: ESOPHAGOGASTRODUODENOSCOPY (EGD) WITH PROPOFOL (N/A )  Patient Location: PACU  Anesthesia Type:General  Level of Consciousness: awake, alert  and oriented  Airway & Oxygen Therapy: Patient Spontanous Breathing  Post-op Assessment: Report given to RN  Post vital signs: Reviewed and stable  Last Vitals:  Vitals Value Taken Time  BP 147/83 01/09/20 0951  Temp 35.6 C 01/09/20 0949  Pulse 80 01/09/20 0951  Resp 17 01/09/20 0951  SpO2 99 % 01/09/20 0951  Vitals shown include unvalidated device data.  Last Pain:  Vitals:   01/09/20 0949  TempSrc: Temporal  PainSc:          Complications: No complications documented.

## 2020-01-12 ENCOUNTER — Encounter: Payer: Self-pay | Admitting: Gastroenterology

## 2020-01-13 ENCOUNTER — Ambulatory Visit
Admission: RE | Admit: 2020-01-13 | Discharge: 2020-01-13 | Disposition: A | Discharge status: Home / Self Care | Payer: Medicare Other | Attending: Gastroenterology | Admitting: Gastroenterology

## 2020-01-13 ENCOUNTER — Encounter
Admission: RE | Disposition: A | Discharge status: Home / Self Care | Payer: Self-pay | Source: Home / Self Care | Attending: Gastroenterology

## 2020-01-13 ENCOUNTER — Ambulatory Visit: Payer: Medicare Other | Admitting: Anesthesiology

## 2020-01-13 ENCOUNTER — Encounter: Payer: Self-pay | Admitting: *Deleted

## 2020-01-13 ENCOUNTER — Other Ambulatory Visit: Payer: Self-pay

## 2020-01-13 DIAGNOSIS — Z86711 Personal history of pulmonary embolism: Secondary | ICD-10-CM | POA: Insufficient documentation

## 2020-01-13 DIAGNOSIS — Z79890 Hormone replacement therapy (postmenopausal): Secondary | ICD-10-CM | POA: Diagnosis not present

## 2020-01-13 DIAGNOSIS — Z886 Allergy status to analgesic agent status: Secondary | ICD-10-CM | POA: Diagnosis not present

## 2020-01-13 DIAGNOSIS — R1084 Generalized abdominal pain: Secondary | ICD-10-CM | POA: Diagnosis present

## 2020-01-13 DIAGNOSIS — Z885 Allergy status to narcotic agent status: Secondary | ICD-10-CM | POA: Insufficient documentation

## 2020-01-13 DIAGNOSIS — R630 Anorexia: Secondary | ICD-10-CM | POA: Diagnosis present

## 2020-01-13 DIAGNOSIS — F419 Anxiety disorder, unspecified: Secondary | ICD-10-CM | POA: Diagnosis not present

## 2020-01-13 DIAGNOSIS — M1990 Unspecified osteoarthritis, unspecified site: Secondary | ICD-10-CM | POA: Insufficient documentation

## 2020-01-13 DIAGNOSIS — K219 Gastro-esophageal reflux disease without esophagitis: Secondary | ICD-10-CM | POA: Insufficient documentation

## 2020-01-13 DIAGNOSIS — D573 Sickle-cell trait: Secondary | ICD-10-CM | POA: Diagnosis not present

## 2020-01-13 DIAGNOSIS — E039 Hypothyroidism, unspecified: Secondary | ICD-10-CM | POA: Insufficient documentation

## 2020-01-13 DIAGNOSIS — K3189 Other diseases of stomach and duodenum: Secondary | ICD-10-CM | POA: Diagnosis not present

## 2020-01-13 DIAGNOSIS — Z79811 Long term (current) use of aromatase inhibitors: Secondary | ICD-10-CM | POA: Diagnosis not present

## 2020-01-13 DIAGNOSIS — C50911 Malignant neoplasm of unspecified site of right female breast: Secondary | ICD-10-CM | POA: Insufficient documentation

## 2020-01-13 DIAGNOSIS — G6282 Radiation-induced polyneuropathy: Secondary | ICD-10-CM | POA: Diagnosis not present

## 2020-01-13 DIAGNOSIS — K449 Diaphragmatic hernia without obstruction or gangrene: Secondary | ICD-10-CM | POA: Diagnosis not present

## 2020-01-13 DIAGNOSIS — Z79899 Other long term (current) drug therapy: Secondary | ICD-10-CM | POA: Diagnosis not present

## 2020-01-13 DIAGNOSIS — F329 Major depressive disorder, single episode, unspecified: Secondary | ICD-10-CM | POA: Insufficient documentation

## 2020-01-13 DIAGNOSIS — I10 Essential (primary) hypertension: Secondary | ICD-10-CM | POA: Insufficient documentation

## 2020-01-13 HISTORY — PX: ESOPHAGOGASTRODUODENOSCOPY: SHX5428

## 2020-01-13 SURGERY — EGD (ESOPHAGOGASTRODUODENOSCOPY)
Anesthesia: General

## 2020-01-13 MED ORDER — PROPOFOL 500 MG/50ML IV EMUL
INTRAVENOUS | Status: DC | PRN
Start: 2020-01-13 — End: 2020-01-13
  Administered 2020-01-13: 100 ug/kg/min via INTRAVENOUS

## 2020-01-13 MED ORDER — LIDOCAINE HCL (CARDIAC) PF 100 MG/5ML IV SOSY
PREFILLED_SYRINGE | INTRAVENOUS | Status: DC | PRN
  Administered 2020-01-13: 100 mg via INTRAVENOUS

## 2020-01-13 MED ORDER — SODIUM CHLORIDE 0.9 % IV SOLN: INTRAVENOUS | Status: DC

## 2020-01-13 MED ORDER — PROPOFOL 500 MG/50ML IV EMUL
INTRAVENOUS | Status: DC | PRN
  Administered 2020-01-13: 100 mg via INTRAVENOUS

## 2020-01-13 NOTE — Anesthesia Preprocedure Evaluation (Signed)
Anesthesia Evaluation  Patient identified by MRN, date of birth, ID band Patient awake    Reviewed: Allergy & Precautions, NPO status , Patient's Chart, lab work & pertinent test results  History of Anesthesia Complications Negative for: history of anesthetic complications  Airway Mallampati: II       Dental   Pulmonary neg sleep apnea, neg COPD, Not current smoker,           Cardiovascular hypertension, Pt. on medications (-) Past MI and (-) CHF (-) dysrhythmias (-) Valvular Problems/Murmurs     Neuro/Psych neg Seizures Anxiety Depression    GI/Hepatic Neg liver ROS, GERD  Medicated and Controlled,  Endo/Other  neg diabetesHypothyroidism   Renal/GU negative Renal ROS     Musculoskeletal   Abdominal   Peds  Hematology  (+) anemia ,   Anesthesia Other Findings   Reproductive/Obstetrics                             Anesthesia Physical Anesthesia Plan  ASA: III  Anesthesia Plan: General   Post-op Pain Management:    Induction: Intravenous  PONV Risk Score and Plan: 2 and Propofol infusion, TIVA and Treatment may vary due to age or medical condition  Airway Management Planned: Nasal Cannula  Additional Equipment:   Intra-op Plan:   Post-operative Plan:   Informed Consent: I have reviewed the patients History and Physical, chart, labs and discussed the procedure including the risks, benefits and alternatives for the proposed anesthesia with the patient or authorized representative who has indicated his/her understanding and acceptance.       Plan Discussed with:   Anesthesia Plan Comments:         Anesthesia Quick Evaluation

## 2020-01-13 NOTE — H&P (Signed)
Outpatient short stay form Pre-procedure 01/13/2020 12:21 PM Alexis Miyamoto MD, MPH  Primary Physician: Dr. Ouida Sills  Reason for visit:  Nausea  History of present illness:   69 y/o lady with history of abdominal pain with unknown cause. Last took coumadin over 5 days ago. No family history of GI malignancies.    Current Facility-Administered Medications:  .  0.9 %  sodium chloride infusion, , Intravenous, Continuous, Christeena Krogh, Hilton Cork, MD, Last Rate: 20 mL/hr at 01/13/20 1140, New Bag at 01/13/20 1140  Medications Prior to Admission  Medication Sig Dispense Refill Last Dose  . carvedilol (COREG) 6.25 MG tablet Take 6.25 mg by mouth 2 (two) times daily with a meal.   01/12/2020 at 2100  . Cholecalciferol (VITAMIN D3 PO) Take 600 mg by mouth 2 (two) times daily.   01/12/2020 at Unknown time  . Cyanocobalamin (VITAMIN B-12) 500 MCG LOZG Take 500 mcg by mouth daily. am    01/12/2020 at Unknown time  . DULoxetine (CYMBALTA) 20 MG capsule Take 20 mg by mouth daily.   01/12/2020 at Unknown time  . gabapentin (NEURONTIN) 300 MG capsule Take 300 mg by mouth 2 (two) times daily.   01/12/2020 at Unknown time  . letrozole (FEMARA) 2.5 MG tablet Take 2.5 mg by mouth daily. pm   01/12/2020 at Unknown time  . levothyroxine (SYNTHROID, LEVOTHROID) 175 MCG tablet Take 175 mcg by mouth daily before breakfast. Am    01/12/2020 at Unknown time  . magnesium oxide (MAG-OX) 400 MG tablet Take 800 mg by mouth daily.   01/12/2020 at Unknown time  . Multiple Vitamin (MULTIVITAMIN) tablet Take 1 tablet by mouth daily.   01/12/2020 at Unknown time  . pantoprazole (PROTONIX) 40 MG tablet Take 40 mg by mouth daily.   01/12/2020 at Unknown time  . potassium chloride (KLOR-CON) 20 MEQ packet Take by mouth 2 (two) times daily. Am and pm   01/12/2020 at Unknown time  . warfarin (COUMADIN) 6 MG tablet Take 6 mg by mouth daily. Pm   01/04/2020 at Unknown time  . acetaminophen (TYLENOL) 500 MG tablet Take 500 mg by mouth every 6  (six) hours as needed.     . benzonatate (TESSALON) 200 MG capsule Take 1 capsule (200 mg total) by mouth 3 (three) times daily as needed for cough. 30 capsule 0   . Calcium Carbonate-Vitamin D (CALTRATE 600+D PO) Take by mouth 2 (two) times daily with a meal.     . chlorpheniramine-HYDROcodone (TUSSIONEX PENNKINETIC ER) 10-8 MG/5ML SUER Take 5 mLs by mouth every 12 (twelve) hours as needed for cough. 60 mL 0   . DULoxetine (CYMBALTA) 60 MG capsule Take 60 mg by mouth daily.     . meclizine (ANTIVERT) 25 MG tablet Take 1 tablet (25 mg total) by mouth 3 (three) times daily as needed for dizziness. 30 tablet 0   . ondansetron (ZOFRAN ODT) 4 MG disintegrating tablet Take 1 tablet (4 mg total) by mouth every 8 (eight) hours as needed for nausea or vomiting. 20 tablet 0   . oseltamivir (TAMIFLU) 75 MG capsule Take 1 capsule (75 mg total) by mouth 2 (two) times daily. X 5 days 10 capsule 0   . sucralfate (CARAFATE) 1 g tablet Take 1 g by mouth 4 (four) times daily. 4x daily before meals and nightly   01/10/2020  . torsemide (DEMADEX) 20 MG tablet Take 20 mg by mouth daily. am     . traZODone (DESYREL) 100 MG tablet Take 100  mg by mouth at bedtime.        Allergies  Allergen Reactions  . Asa [Aspirin]     Sickle cell trait/ told not to take  . Celebrex [Celecoxib] Swelling  . Codeine Nausea And Vomiting  . Oxycodone Itching     Past Medical History:  Diagnosis Date  . Anemia    pernicious anemia  . Anxiety   . Arthritis   . Cancer [facility]) 2014   breast right side  . Depression   . GERD (gastroesophageal reflux disease)   . Headache    migraines/ approx 2 per week  . Hx of blood clots 12 yrs ago   in lungs, now on coumadin  . Hypertension    controlled on meds  . Hypothyroidism   . 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 Neuropathy    fingers, toes and balls of feet  . Pulmonary embolism ([place])   . Sickle cell trait ([place])   .  Vertigo     Review of systems:  Otherwise negative.    Physical Exam  Gen: Alert, oriented. Appears stated age.  HEENT: [place]/AT. PERRLA. Lungs: No respiratory distress Abd: soft, benign, no masses.  Ext: No edema.     Planned procedures: Proceed with EGD. The patient understands the nature of the planned procedure, indications, risks, alternatives and potential complications including but not limited to bleeding, infection, perforation, damage to internal organs and possible oversedation/side effects from anesthesia. The patient agrees and gives consent to proceed.  Please refer to procedure notes for findings, recommendations and patient disposition/instructions.     Alexis Miyamoto MD, MPH Gastroenterology 01/13/2020  12:21 PM

## 2020-01-13 NOTE — Interval H&P Note (Signed)
History and Physical Interval Note:  01/13/2020 12:23 PM  Alexis Atkins  has presented today for surgery, with the diagnosis of upper abdominal pain.  The various methods of treatment have been discussed with the patient and family. After consideration of risks, benefits and other options for treatment, the patient has consented to  Procedure(s): ESOPHAGOGASTRODUODENOSCOPY (EGD) (N/A) as a surgical intervention.  The patient's history has been reviewed, patient examined, no change in status, stable for surgery.  I have reviewed the patient's chart and labs.  Questions were answered to the patient's satisfaction.     [name]  Ok to proceed with EGD

## 2020-01-13 NOTE — Op Note (Signed)
[facility] Gastroenterology Patient Name: Alexis Atkins Procedure Date: 01/13/2020 12:15 PM MRN: [id number] Account #: [id number] Date of Birth: [birth year] Admit Type: Outpatient Age: 69 Room: [facility] ENDO ROOM 3 Gender: Female Note Status: Finalized Procedure:             Upper GI endoscopy Indications:           Generalized abdominal pain, Anorexia Providers:             Andrey Farmer MD, MD Referring MD:          Ocie Cornfield. Ouida Sills MD, MD (Referring MD) Medicines:             Monitored Anesthesia Care Complications:         No immediate complications. Estimated blood loss:                         Minimal. Procedure:             Pre-Anesthesia Assessment:                        - Prior to the procedure, a History and Physical was                         performed, and patient medications and allergies were                         reviewed. The patient is competent. The risks and                         benefits of the procedure and the sedation options and                         risks were discussed with the patient. All questions                         were answered and informed consent was obtained.                         Patient identification and proposed procedure were                         verified by the physician, the nurse, the anesthetist                         and the technician in the endoscopy suite. Mental                         Status Examination: alert and oriented. Airway                         Examination: normal oropharyngeal airway and neck                         mobility. Respiratory Examination: clear to                         auscultation. CV Examination: normal. Prophylactic  Antibiotics: The patient does not require prophylactic                         antibiotics. Prior Anticoagulants: The patient has                         taken Coumadin (warfarin), last dose was 5 days prior                          to procedure. ASA Grade Assessment: III - A patient                         with severe systemic disease. After reviewing the                         risks and benefits, the patient was deemed in                         satisfactory condition to undergo the procedure. The                         anesthesia plan was to use monitored anesthesia care                         (MAC). Immediately prior to administration of                         medications, the patient was re-assessed for adequacy                         to receive sedatives. The heart rate, respiratory                         rate, oxygen saturations, blood pressure, adequacy of                         pulmonary ventilation, and response to care were                         monitored throughout the procedure. The physical                         status of the patient was re-assessed after the                         procedure.                        After obtaining informed consent, the endoscope was                         passed under direct vision. Throughout the procedure,                         the patient's blood pressure, pulse, and oxygen                         saturations were monitored continuously. The Endoscope  was introduced through the mouth, and advanced to the                         second part of duodenum. The upper GI endoscopy was                         accomplished without difficulty. The patient tolerated                         the procedure well. Findings:      A small hiatal hernia was present.      Localized mildly erythematous mucosa without bleeding was found in the       gastric antrum. Biopsies were taken with a cold forceps for Helicobacter       pylori testing. Estimated blood loss was minimal.      Localized mildly erythematous mucosa without active bleeding and with no       stigmata of bleeding was found in the duodenal bulb. Biopsies were taken       with a cold  forceps for histology. Estimated blood loss was minimal.      The exam of the duodenum was otherwise normal. Impression:            - Small hiatal hernia.                        - Erythematous mucosa in the antrum. Biopsied.                        - Erythematous duodenopathy. Biopsied. Recommendation:        - Discharge patient to home.                        - Resume previous diet.                        - Continue present medications.                        - Await pathology results.                        - Return to referring physician as previously                         scheduled.                        - Resume Coumadin (warfarin) at prior dose today. Procedure Code(s):     --- Professional ---                        [phone], Esophagogastroduodenoscopy, flexible,                         transoral; with biopsy, single or multiple Diagnosis Code(s):     --- Professional ---                        K44.9, Diaphragmatic hernia without obstruction or  gangrene                        K31.89, Other diseases of stomach and duodenum                        R10.84, Generalized abdominal pain                        R63.0, Anorexia CPT copyright 2019 American Medical Association. All rights reserved. The codes documented in this report are preliminary and upon coder review may  be revised to meet current compliance requirements. Andrey Farmer, MD Andrey Farmer MD, MD 01/13/2020 12:42:00 PM Number of Addenda: 0 Note Initiated On: 01/13/2020 12:15 PM Estimated Blood Loss:  Estimated blood loss was minimal.      [facility]

## 2020-01-13 NOTE — Transfer of Care (Signed)
Immediate Anesthesia Transfer of Care Note  Patient: Alexis Atkins  Procedure(s) Performed: ESOPHAGOGASTRODUODENOSCOPY (EGD) (N/A )  Patient Location: PACU and Endoscopy Unit  Anesthesia Type:General  Level of Consciousness: sedated  Airway & Oxygen Therapy: Patient Spontanous Breathing and Patient connected to nasal cannula oxygen  Post-op Assessment: Report given to RN and Post -op Vital signs reviewed and stable  Post vital signs: Reviewed and stable  Last Vitals:  Vitals Value Taken Time  BP 122/80 01/13/20 1241  Temp    Pulse 83 01/13/20 1241  Resp 23 01/13/20 1241  SpO2 98 % 01/13/20 1241  Vitals shown include unvalidated device data.  Last Pain:  Vitals:   01/13/20 1129  TempSrc: Oral  PainSc: 0-No pain         Complications: No complications documented.

## 2020-01-14 ENCOUNTER — Encounter: Payer: Self-pay | Admitting: Gastroenterology

## 2020-01-14 NOTE — Anesthesia Postprocedure Evaluation (Signed)
Anesthesia Post Note  Patient: Alexis Atkins  Procedure(s) Performed: ESOPHAGOGASTRODUODENOSCOPY (EGD) (N/A )  Patient location during evaluation: Endoscopy Anesthesia Type: General Level of consciousness: awake and alert Pain management: pain level controlled Vital Signs Assessment: post-procedure vital signs reviewed and stable Respiratory status: spontaneous breathing and respiratory function stable Cardiovascular status: stable Anesthetic complications: no   No complications documented.   Last Vitals:  Vitals:   01/13/20 1129 01/13/20 1241  BP: (!) 149/81 122/80  Pulse: 84 99  Resp: 18 (!) 23  Temp:  (!) 36.1 C  SpO2: 100% 99%    Last Pain:  Vitals:   01/14/20 0747  TempSrc:   PainSc: 0-No pain                 Omir Cooprider K

## 2020-01-16 ENCOUNTER — Other Ambulatory Visit: Payer: Self-pay

## 2020-01-16 ENCOUNTER — Ambulatory Visit
Admission: RE | Admit: 2020-01-16 | Discharge: 2020-01-16 | Disposition: A | Discharge status: Home / Self Care | Payer: Medicare Other | Source: Ambulatory Visit | Attending: Sports Medicine | Admitting: Sports Medicine

## 2020-01-16 DIAGNOSIS — M5137 Other intervertebral disc degeneration, lumbosacral region: Secondary | ICD-10-CM | POA: Diagnosis present

## 2020-01-16 DIAGNOSIS — M47816 Spondylosis without myelopathy or radiculopathy, lumbar region: Secondary | ICD-10-CM | POA: Diagnosis present

## 2020-01-16 DIAGNOSIS — M545 Low back pain: Secondary | ICD-10-CM | POA: Diagnosis present

## 2020-01-16 DIAGNOSIS — G8929 Other chronic pain: Secondary | ICD-10-CM | POA: Diagnosis present

## 2020-01-19 ENCOUNTER — Other Ambulatory Visit: Payer: Self-pay | Admitting: Gastroenterology

## 2020-01-19 DIAGNOSIS — R1011 Right upper quadrant pain: Secondary | ICD-10-CM

## 2020-01-19 LAB — SURGICAL PATHOLOGY

## 2020-01-23 ENCOUNTER — Other Ambulatory Visit: Payer: Self-pay | Admitting: Sports Medicine

## 2020-01-23 DIAGNOSIS — S22050A Wedge compression fracture of T5-T6 vertebra, initial encounter for closed fracture: Secondary | ICD-10-CM

## 2020-01-26 ENCOUNTER — Other Ambulatory Visit: Payer: Self-pay

## 2020-01-26 ENCOUNTER — Ambulatory Visit
Admission: RE | Admit: 2020-01-26 | Discharge: 2020-01-26 | Disposition: A | Discharge status: Home / Self Care | Payer: Medicare Other | Source: Ambulatory Visit | Attending: Sports Medicine | Admitting: Sports Medicine

## 2020-01-26 DIAGNOSIS — S22050A Wedge compression fracture of T5-T6 vertebra, initial encounter for closed fracture: Secondary | ICD-10-CM | POA: Diagnosis not present

## 2020-02-06 ENCOUNTER — Ambulatory Visit: Payer: PRIVATE HEALTH INSURANCE

## 2020-02-11 ENCOUNTER — Other Ambulatory Visit: Payer: PRIVATE HEALTH INSURANCE

## 2020-02-13 ENCOUNTER — Encounter
Admission: RE | Admit: 2020-02-13 | Discharge: 2020-02-13 | Disposition: A | Discharge status: Home / Self Care | Payer: Medicare Other | Source: Ambulatory Visit | Attending: Gastroenterology | Admitting: Gastroenterology

## 2020-02-13 ENCOUNTER — Other Ambulatory Visit: Payer: Self-pay

## 2020-02-13 DIAGNOSIS — R1011 Right upper quadrant pain: Secondary | ICD-10-CM | POA: Diagnosis present

## 2020-02-13 DIAGNOSIS — R1012 Left upper quadrant pain: Secondary | ICD-10-CM | POA: Insufficient documentation

## 2020-02-13 MED ORDER — TECHNETIUM TC 99M SULFUR COLLOID
2.0000 | Freq: Once | INTRAVENOUS | Status: AC | PRN
  Administered 2020-02-13: 2.06 via INTRAVENOUS

## 2021-06-08 ENCOUNTER — Other Ambulatory Visit: Payer: Self-pay | Admitting: Orthopedic Surgery

## 2021-06-08 DIAGNOSIS — Z96651 Presence of right artificial knee joint: Secondary | ICD-10-CM

## 2021-06-08 DIAGNOSIS — M5416 Radiculopathy, lumbar region: Secondary | ICD-10-CM

## 2021-06-10 ENCOUNTER — Other Ambulatory Visit: Payer: Self-pay | Admitting: Orthopedic Surgery

## 2021-06-10 DIAGNOSIS — M5416 Radiculopathy, lumbar region: Secondary | ICD-10-CM

## 2022-01-30 IMAGING — MR MR THORACIC SPINE W/O CM
6 series · 29 of 48 positions shown · non-contrast
Comparison: None.

CLINICAL DATA: 3-4 months of bilateral pain below the breast.
Closed wedge compression fracture of T5

EXAM:
MRI THORACIC SPINE WITHOUT CONTRAST
TECHNIQUE: Multiplanar, multisequence MR imaging of the thoracic spine was
performed. No intravenous contrast was administered.

[Series 18: T1 · sagittal · 6.0mm · 1.88mm/px · 2 of 9 slices shown (1 of 2)]
[im 1/9]
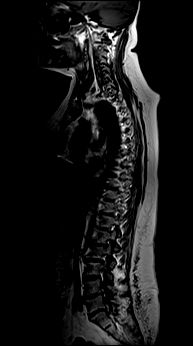
[im 9/9]
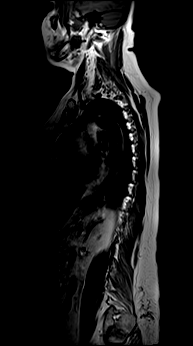

[Series 19: T2 · sagittal · 3.0mm · 1.06mm/px · 6 of 17 slices shown (1 of 2)]
[im 1/17]
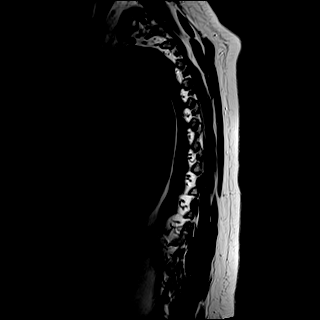
[im 4/17]
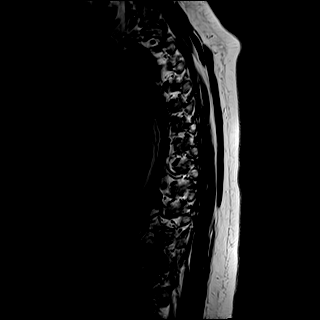
[im 7/17]
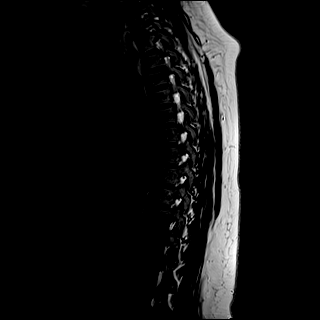
[im 10/17]
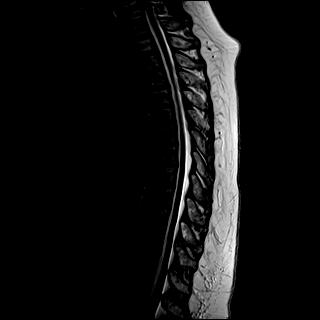
[im 13/17]
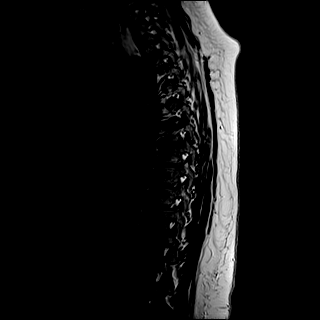
[im 17/17]
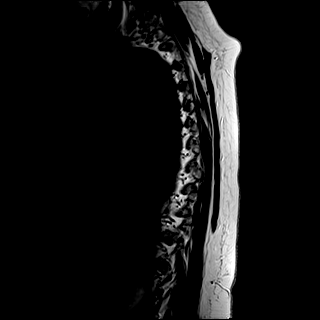

[Series 20: T1 · sagittal · 3.0mm · 1.06mm/px · 6 of 17 slices shown (2 of 2)]
[im 1/17]
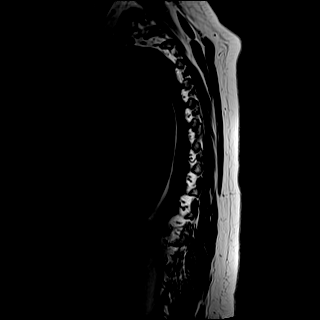
[im 4/17]
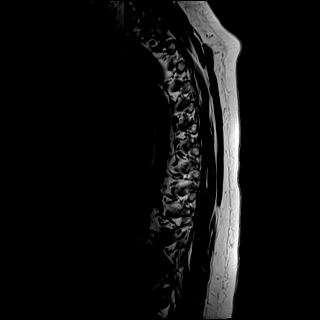
[im 7/17]
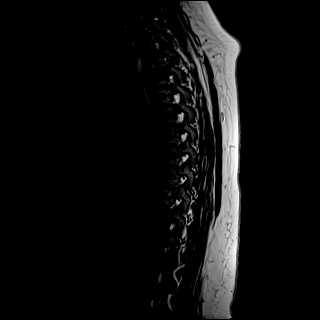
[im 10/17]
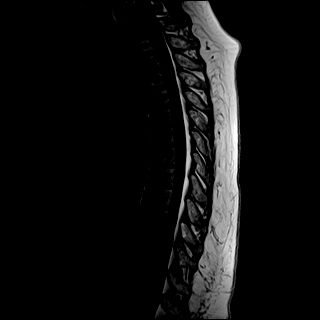
[im 13/17]
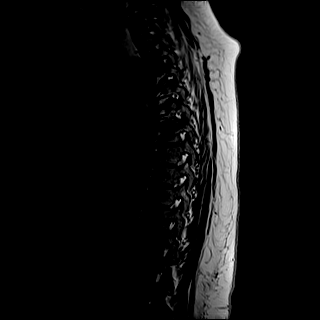
[im 17/17]
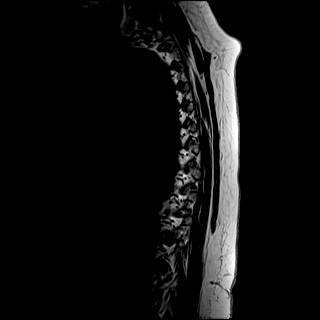

[Series 21: STIR · sagittal · 3.0mm · 0.53mm/px · 6 of 17 slices shown]
[im 1/17]
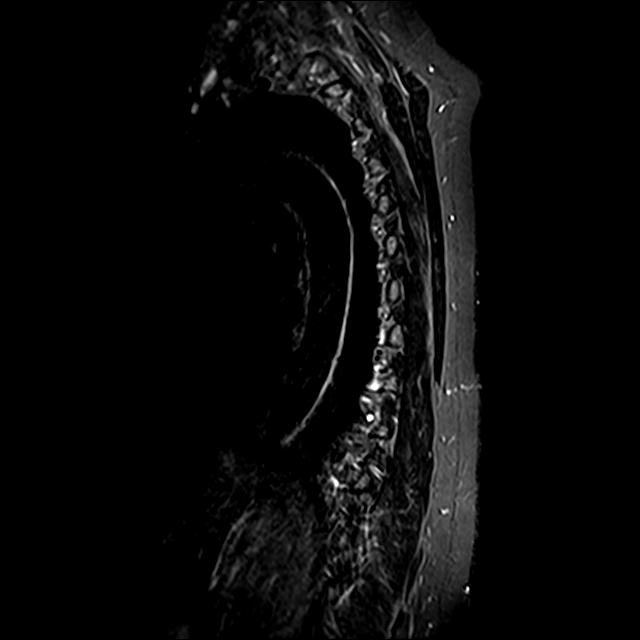
[im 4/17]
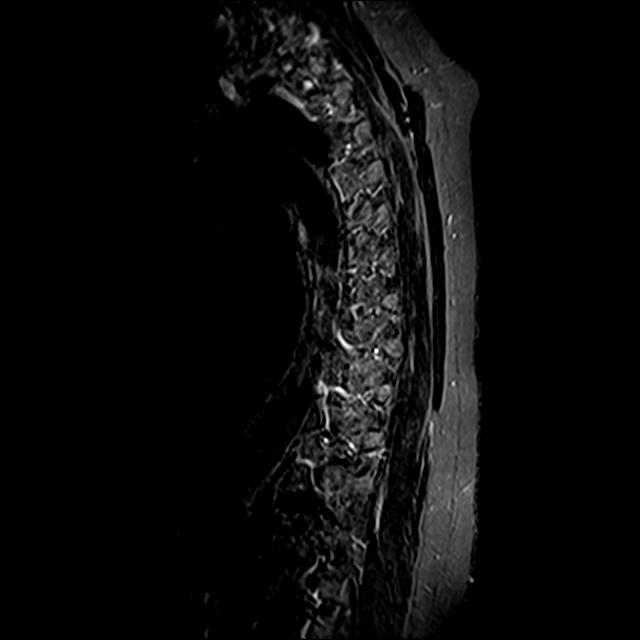
[im 7/17]
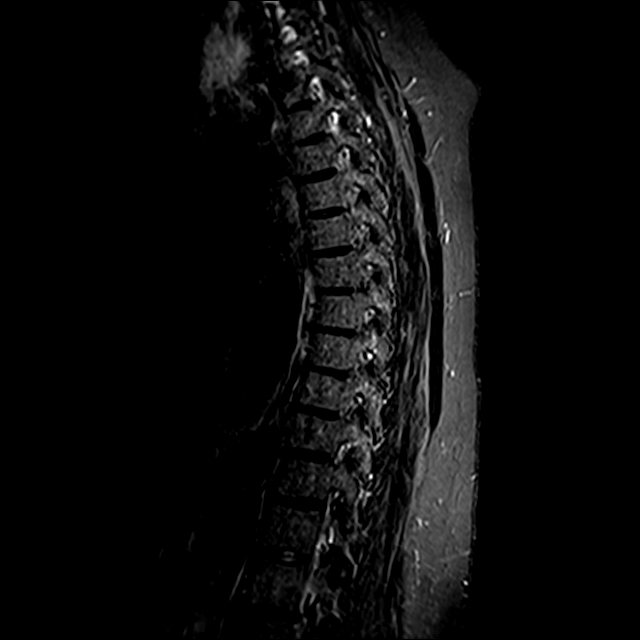
[im 10/17]
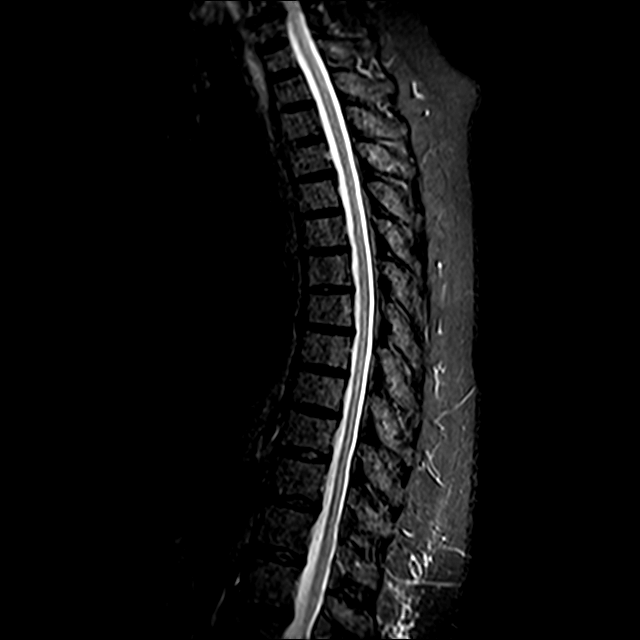
[im 13/17]
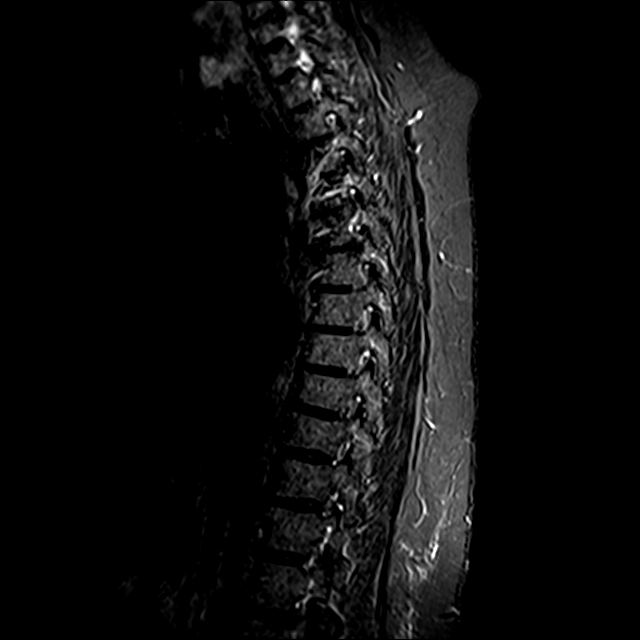
[im 17/17]
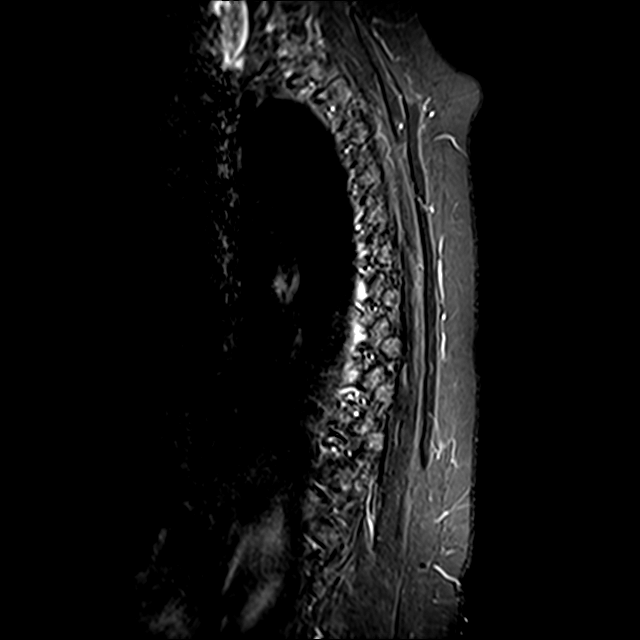

[Series 22: T2 · axial · 4.0mm · 0.59mm/px · z∈[-362,-146]mm · 8 of 39 slices shown (2 of 2)]
[im 1/39]
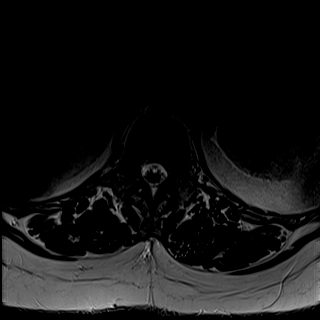
[im 6/39]
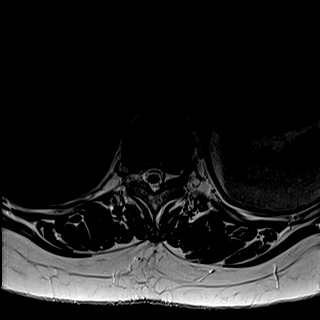
[im 12/39]
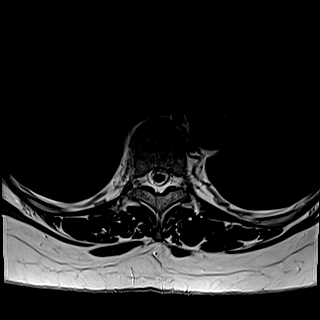
[im 18/39]
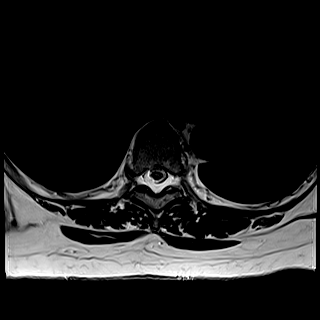
[im 21/39]
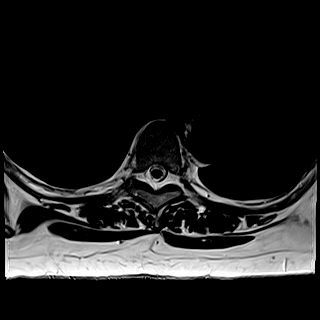
[im 27/39]
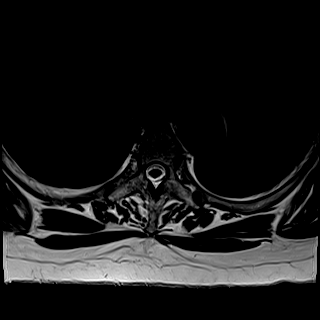
[im 33/39]
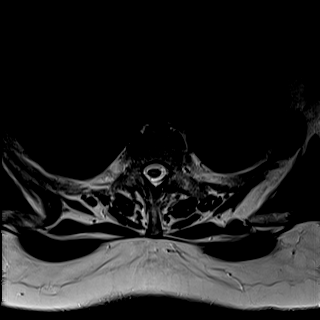
[im 39/39]
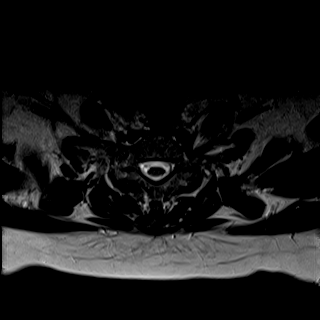

[Series 23: GRE · axial · 4.0mm · 0.37mm/px · 1 of 39 slices shown]
[im 1/39]
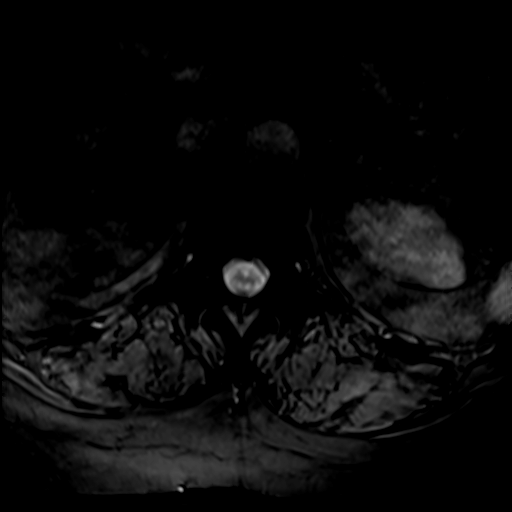

[29 of 48 positions shown; findings below may reference images not displayed]

FINDINGS: Alignment:  Unremarkable

Vertebrae: T2-3 incomplete segmentation. No fracture, discitis, or
aggressive bone lesion.

Cord:  Normal signal and morphology.

Paraspinal and other soft tissues: Negative. No evidence of mass or
inflammation.

Disc levels:

Mild disc desiccation and narrowing in keeping with age. No large or
significant herniation. Asymmetric left facet spurring at T11-12
causing mild or moderate left foraminal stenosis. The other thoracic
foramina are diffusely patent. C5-6 disc degeneration with disc
bulging and ridging, only covered on sagittal images.
IMPRESSION: 1. Overall mild thoracic degenerative change for age. No cord
impingement.
2. T11-12 mild to moderate left foraminal narrowing from asymmetric
facet spurring.
3. Negative for compression fracture.

## 2022-02-13 ENCOUNTER — Ambulatory Visit
Admission: RE | Admit: 2022-02-13 | Discharge: 2022-02-13 | Disposition: A | Discharge status: Home / Self Care | Payer: Medicare Other | Attending: Gastroenterology | Admitting: Gastroenterology

## 2022-02-13 ENCOUNTER — Ambulatory Visit: Payer: Medicare Other | Admitting: Certified Registered"

## 2022-02-13 ENCOUNTER — Encounter
Admission: RE | Disposition: A | Discharge status: Home / Self Care | Payer: Self-pay | Source: Home / Self Care | Attending: Gastroenterology

## 2022-02-13 ENCOUNTER — Encounter: Payer: Self-pay | Admitting: Gastroenterology

## 2022-02-13 DIAGNOSIS — Z86711 Personal history of pulmonary embolism: Secondary | ICD-10-CM | POA: Diagnosis not present

## 2022-02-13 DIAGNOSIS — Z9221 Personal history of antineoplastic chemotherapy: Secondary | ICD-10-CM | POA: Insufficient documentation

## 2022-02-13 DIAGNOSIS — K640 First degree hemorrhoids: Secondary | ICD-10-CM | POA: Insufficient documentation

## 2022-02-13 DIAGNOSIS — Z6833 Body mass index (BMI) 33.0-33.9, adult: Secondary | ICD-10-CM | POA: Diagnosis not present

## 2022-02-13 DIAGNOSIS — E039 Hypothyroidism, unspecified: Secondary | ICD-10-CM | POA: Diagnosis not present

## 2022-02-13 DIAGNOSIS — F419 Anxiety disorder, unspecified: Secondary | ICD-10-CM | POA: Insufficient documentation

## 2022-02-13 DIAGNOSIS — I10 Essential (primary) hypertension: Secondary | ICD-10-CM | POA: Insufficient documentation

## 2022-02-13 DIAGNOSIS — K625 Hemorrhage of anus and rectum: Secondary | ICD-10-CM | POA: Diagnosis not present

## 2022-02-13 DIAGNOSIS — D128 Benign neoplasm of rectum: Secondary | ICD-10-CM | POA: Diagnosis not present

## 2022-02-13 DIAGNOSIS — Z923 Personal history of irradiation: Secondary | ICD-10-CM | POA: Insufficient documentation

## 2022-02-13 DIAGNOSIS — Z86718 Personal history of other venous thrombosis and embolism: Secondary | ICD-10-CM | POA: Diagnosis not present

## 2022-02-13 DIAGNOSIS — G6282 Radiation-induced polyneuropathy: Secondary | ICD-10-CM | POA: Insufficient documentation

## 2022-02-13 DIAGNOSIS — Z7901 Long term (current) use of anticoagulants: Secondary | ICD-10-CM | POA: Insufficient documentation

## 2022-02-13 DIAGNOSIS — Z853 Personal history of malignant neoplasm of breast: Secondary | ICD-10-CM | POA: Diagnosis not present

## 2022-02-13 DIAGNOSIS — Z96651 Presence of right artificial knee joint: Secondary | ICD-10-CM | POA: Diagnosis not present

## 2022-02-13 DIAGNOSIS — K5730 Diverticulosis of large intestine without perforation or abscess without bleeding: Secondary | ICD-10-CM | POA: Insufficient documentation

## 2022-02-13 DIAGNOSIS — D510 Vitamin B12 deficiency anemia due to intrinsic factor deficiency: Secondary | ICD-10-CM | POA: Insufficient documentation

## 2022-02-13 DIAGNOSIS — F32A Depression, unspecified: Secondary | ICD-10-CM | POA: Insufficient documentation

## 2022-02-13 HISTORY — PX: COLONOSCOPY: SHX5424

## 2022-02-13 SURGERY — COLONOSCOPY
Anesthesia: General

## 2022-02-13 MED ORDER — SODIUM CHLORIDE 0.9 % IV SOLN: INTRAVENOUS | Status: DC

## 2022-02-13 MED ORDER — PROPOFOL 1000 MG/100ML IV EMUL
INTRAVENOUS | Status: AC
  Filled 2022-02-13: qty 100

## 2022-02-13 MED ORDER — LIDOCAINE HCL (CARDIAC) PF 100 MG/5ML IV SOSY
PREFILLED_SYRINGE | INTRAVENOUS | Status: DC | PRN
  Administered 2022-02-13: 50 mg via INTRAVENOUS

## 2022-02-13 MED ORDER — PROPOFOL 10 MG/ML IV BOLUS
INTRAVENOUS | Status: DC | PRN
  Administered 2022-02-13: 60 mg via INTRAVENOUS

## 2022-02-13 MED ORDER — PROPOFOL 500 MG/50ML IV EMUL
INTRAVENOUS | Status: DC | PRN
  Administered 2022-02-13: 125 ug/kg/min via INTRAVENOUS

## 2022-02-13 NOTE — H&P (Signed)
Pre-Procedure H&P   Patient ID: Alexis Atkins is a 71 y.o. female.  Gastroenterology Provider: Annamaria Helling, DO  Referring Provider: Octavia Bruckner, PA PCP: Kirk Ruths, MD  Date: 02/13/2022  HPI Alexis Atkins is a 71 y.o. female who presents today for Colonoscopy for Rectal bleeding. Patient on Xarelto which has been held prior to procedure (last dose on Thursday 9/14). H/o dvt/pe. H/o sickle cell trait and breast ca (s/p chemo and rads)  Undergoing colonoscopy today for several months of intermittent rectal bleeding.  Bleeding has been painless in nature and self-limited. She does have intermittent rectal pain independent of the BRBPR. Denies straining. Reports she has 1 bm every 3 days. Takes miralax with improvement.  Last colonoscopy performed in July 2019. Noted diverticulosis, hyperplastic polyps, hemorrhoids.  08/2007- csy- ih; 04/2003 csy- hyperplastic polyps.  No fhx crc or polyps.  Past Medical History:  Diagnosis Date   Anemia    pernicious anemia   Anxiety    Arthritis    Cancer ([place]) 2014   breast right side   Depression    GERD (gastroesophageal reflux disease)    Headache    migraines/ approx 2 per week   Hx of blood clots 12 yrs ago   in lungs, now on coumadin   Hypertension    controlled on meds   Hypothyroidism    Neuromuscular disorder (HCC)    neuropathy in hands and feet, radiation from breast cancer   Neuropathy    fingers, toes and balls of feet   Pulmonary embolism (HCC)    Sickle cell trait ([place])    Vertigo     Past Surgical History:  Procedure Laterality Date   ARTERY BIOPSY Left 07/18/2016   Procedure: BIOPSY TEMPORAL ARTERY;  Surgeon: Clyde Canterbury, MD;  Location: [place];  Service: ENT;  Laterality: Left;   BREAST SURGERY Right 2014   CESAREAN SECTION     x1   COLONOSCOPY  05/28/2003, 09/12/2007   COLONOSCOPY WITH PROPOFOL N/A 11/26/2017   Procedure: COLONOSCOPY WITH PROPOFOL;  Surgeon: Manya Silvas, MD;  Location: [facility] ENDOSCOPY;  Service: Endoscopy;  Laterality: N/A;   DIAGNOSTIC LAPAROSCOPY     DIAGNOSTIC LAPAROSCOPY     DILATION AND CURETTAGE OF UTERUS     ESOPHAGOGASTRODUODENOSCOPY  05/30/2001   ESOPHAGOGASTRODUODENOSCOPY N/A 01/13/2020   Procedure: ESOPHAGOGASTRODUODENOSCOPY (EGD);  Surgeon: [name], MD;  Location: [facility] ENDOSCOPY;  Service: Endoscopy;  Laterality: N/A;   ESOPHAGOGASTRODUODENOSCOPY (EGD) WITH PROPOFOL N/A 01/09/2020   Procedure: ESOPHAGOGASTRODUODENOSCOPY (EGD) WITH PROPOFOL;  Surgeon: [name], MD;  Location: [facility] ENDOSCOPY;  Service: Endoscopy;  Laterality: N/A;   FOOT SURGERY Bilateral [place] Right 10/10/2017   Procedure: HAMMER TOE CORRECTION-2ND AND 3RD TOE and 4TH;  Surgeon: Samara Deist, DPM;  Location: [place];  Service: Podiatry;  Laterality: Right;  IVA LOCAL HAMMERTOE   KNEE ARTHROSCOPY Right 07/13/06, 05/11/14   REPLACEMENT TOTAL KNEE Right    TONSILLECTOMY      Family History No h/o GI disease or malignancy  Review of Systems  Constitutional:  Negative for activity change, appetite change, chills, diaphoresis, fatigue, fever and unexpected weight change.  HENT:  Negative for trouble swallowing and voice change.   Respiratory:  Negative for shortness of breath and wheezing.   Cardiovascular:  Negative for chest pain, palpitations and leg swelling.  Gastrointestinal:  Positive for anal bleeding and blood in stool. Negative for abdominal distention,  abdominal pain, constipation, diarrhea, nausea, rectal pain and vomiting.  Musculoskeletal:  Negative for arthralgias and myalgias.  Skin:  Negative for color change and pallor.  Neurological:  Negative for dizziness, syncope and weakness.  Psychiatric/Behavioral:  Negative for confusion.   All other systems reviewed and are negative.    Medications No current facility-administered medications on file prior to encounter.   Current  Outpatient Medications on File Prior to Encounter  Medication Sig Dispense Refill   Cholecalciferol (VITAMIN D3 PO) Take 600 mg by mouth 2 (two) times daily.     Cyanocobalamin (VITAMIN B-12) 500 MCG LOZG Take 500 mcg by mouth daily. am      gabapentin (NEURONTIN) 300 MG capsule Take 300 mg by mouth 2 (two) times daily.     levothyroxine (SYNTHROID, LEVOTHROID) 175 MCG tablet Take 175 mcg by mouth daily before breakfast. Am      magnesium oxide (MAG-OX) 400 MG tablet Take 800 mg by mouth daily.     rivaroxaban (XARELTO) 10 MG TABS tablet Take 10 mg by mouth daily.     torsemide (DEMADEX) 20 MG tablet Take 20 mg by mouth daily. am     traZODone (DESYREL) 100 MG tablet Take 100 mg by mouth at bedtime.     acetaminophen (TYLENOL) 500 MG tablet Take 500 mg by mouth every 6 (six) hours as needed.     benzonatate (TESSALON) 200 MG capsule Take 1 capsule (200 mg total) by mouth 3 (three) times daily as needed for cough. 30 capsule 0   Calcium Carbonate-Vitamin D (CALTRATE 600+D PO) Take by mouth 2 (two) times daily with a meal.     carvedilol (COREG) 6.25 MG tablet Take 6.25 mg by mouth 2 (two) times daily with a meal.     chlorpheniramine-HYDROcodone (TUSSIONEX PENNKINETIC ER) 10-8 MG/5ML SUER Take 5 mLs by mouth every 12 (twelve) hours as needed for cough. 60 mL 0   DULoxetine (CYMBALTA) 20 MG capsule Take 20 mg by mouth daily.     DULoxetine (CYMBALTA) 60 MG capsule Take 60 mg by mouth daily.     letrozole (FEMARA) 2.5 MG tablet Take 2.5 mg by mouth daily. pm     meclizine (ANTIVERT) 25 MG tablet Take 1 tablet (25 mg total) by mouth 3 (three) times daily as needed for dizziness. 30 tablet 0   Multiple Vitamin (MULTIVITAMIN) tablet Take 1 tablet by mouth daily.     ondansetron (ZOFRAN ODT) 4 MG disintegrating tablet Take 1 tablet (4 mg total) by mouth every 8 (eight) hours as needed for nausea or vomiting. 20 tablet 0   oseltamivir (TAMIFLU) 75 MG capsule Take 1 capsule (75 mg total) by mouth 2 (two)  times daily. X 5 days 10 capsule 0   pantoprazole (PROTONIX) 40 MG tablet Take 40 mg by mouth daily. (Patient not taking: Reported on 02/13/2022)     potassium chloride (KLOR-CON) 20 MEQ packet Take by mouth 2 (two) times daily. Am and pm     sucralfate (CARAFATE) 1 g tablet Take 1 g by mouth 4 (four) times daily. 4x daily before meals and nightly     warfarin (COUMADIN) 6 MG tablet Take 6 mg by mouth daily. Pm (Patient not taking: Reported on 02/13/2022)      Pertinent medications related to GI and procedure were reviewed by me with the patient prior to the procedure   Current Facility-Administered Medications:    0.9 %  sodium chloride infusion, , Intravenous, Continuous, Annamaria Helling, DO, Last  Rate: 20 mL/hr at 02/13/22 1028, Continued from Pre-op at 02/13/22 1028      Allergies  Allergen Reactions   Asa [Aspirin]     Sickle cell trait/ told not to take   Celebrex [Celecoxib] Swelling   Codeine Nausea And Vomiting   Oxycodone Itching   Allergies were reviewed by me prior to the procedure  Objective   Body mass index is 33.2 kg/m. Vitals:   02/13/22 0941  BP: 135/83  Pulse: 88  Resp: 16  Temp: (!) 96.5 F (35.8 C)  SpO2: 100%  Weight: 96.2 kg  Height: '5\' 7"'$  (1.702 m)     Physical Exam Vitals and nursing note reviewed.  Constitutional:      General: She is not in acute distress.    Appearance: Normal appearance. She is obese. She is not ill-appearing, toxic-appearing or diaphoretic.  HENT:     Head: Normocephalic and atraumatic.     Nose: Nose normal.     Mouth/Throat:     Mouth: Mucous membranes are moist.     Pharynx: Oropharynx is clear.  Eyes:     General: No scleral icterus.    Extraocular Movements: Extraocular movements intact.  Cardiovascular:     Rate and Rhythm: Normal rate and regular rhythm.     Heart sounds: Normal heart sounds. No murmur heard.    No friction rub. No gallop.  Pulmonary:     Effort: Pulmonary effort is normal. No  respiratory distress.     Breath sounds: Normal breath sounds. No wheezing, rhonchi or rales.  Abdominal:     General: Bowel sounds are normal. There is no distension.     Palpations: Abdomen is soft.     Tenderness: There is no abdominal tenderness. There is no guarding or rebound.  Musculoskeletal:     Cervical back: Neck supple.     Right lower leg: No edema.     Left lower leg: No edema.  Skin:    General: Skin is warm and dry.     Coloration: Skin is not jaundiced or pale.  Neurological:     General: No focal deficit present.     Mental Status: She is alert and oriented to person, place, and time. Mental status is at baseline.  Psychiatric:        Mood and Affect: Mood normal.        Behavior: Behavior normal.        Thought Content: Thought content normal.        Judgment: Judgment normal.      Assessment:  Alexis Atkins is a 71 y.o. female  who presents today for Colonoscopy for Rectal bleeding.  Plan:  Colonoscopy with possible intervention today  Colonoscopy with possible biopsy, control of bleeding, polypectomy, and interventions as necessary has been discussed with the patient/patient representative. Informed consent was obtained from the patient/patient representative after explaining the indication, nature, and risks of the procedure including but not limited to death, bleeding, perforation, missed neoplasm/lesions, cardiorespiratory compromise, and reaction to medications. Opportunity for questions was given and appropriate answers were provided. Patient/patient representative has verbalized understanding is amenable to undergoing the procedure.   Annamaria Helling, DO  [facility] Gastroenterology  Portions of the record may have been created with voice recognition software. Occasional wrong-word or 'sound-a-like' substitutions may have occurred due to the inherent limitations of voice recognition software.  Read the chart carefully and recognize, using  context, where substitutions may have occurred.

## 2022-02-13 NOTE — Anesthesia Preprocedure Evaluation (Signed)
Anesthesia Evaluation  Patient identified by MRN, date of birth, ID band Patient awake    Reviewed: Allergy & Precautions, NPO status , Patient's Chart, lab work & pertinent test results  Airway Mallampati: III  TM Distance: >3 FB Neck ROM: full    Dental  (+) Teeth Intact   Pulmonary neg pulmonary ROS,  Remote hx of PE and DVT   Pulmonary exam normal breath sounds clear to auscultation       Cardiovascular Exercise Tolerance: Good hypertension, negative cardio ROS Normal cardiovascular exam Rhythm:Regular     Neuro/Psych  Headaches, Anxiety Depression negative neurological ROS  negative psych ROS   GI/Hepatic negative GI ROS, Neg liver ROS, GERD  Medicated,  Endo/Other  negative endocrine ROSHypothyroidism Morbid obesity  Renal/GU negative Renal ROS  negative genitourinary   Musculoskeletal   Abdominal (+) + obese,   Peds  Hematology negative hematology ROS (+) Blood dyscrasia, anemia ,   Anesthesia Other Findings Past Medical History: No date: Anemia     Comment:  pernicious anemia No date: Anxiety No date: Arthritis 2014: Cancer ([place])     Comment:  breast right side No date: Depression No date: GERD (gastroesophageal reflux disease) No date: Headache     Comment:  migraines/ approx 2 per week 12 yrs ago: Hx of blood clots     Comment:  in lungs, now on coumadin No date: Hypertension     Comment:  controlled on meds No date: Hypothyroidism No date: Neuromuscular disorder (HCC)     Comment:  neuropathy in hands and feet, radiation from breast               cancer No date: Neuropathy     Comment:  fingers, toes and balls of feet No date: Pulmonary embolism (HCC) No date: Sickle cell trait ([place]) No date: Vertigo  Past Surgical History: 07/18/2016: ARTERY BIOPSY; Left     Comment:  Procedure: BIOPSY TEMPORAL ARTERY;  Surgeon: Clyde Canterbury, MD;  Location: [place];  Service:                ENT;  Laterality: Left; 2014: BREAST SURGERY; Right No date: CESAREAN SECTION     Comment:  x1 05/28/2003, 09/12/2007: COLONOSCOPY 11/26/2017: COLONOSCOPY WITH PROPOFOL; N/A     Comment:  Procedure: COLONOSCOPY WITH PROPOFOL;  Surgeon: Manya Silvas, MD;  Location: [facility] ENDOSCOPY;  Service:               Endoscopy;  Laterality: N/A; No date: DIAGNOSTIC LAPAROSCOPY No date: DIAGNOSTIC LAPAROSCOPY No date: DILATION AND CURETTAGE OF UTERUS 05/30/2001: ESOPHAGOGASTRODUODENOSCOPY 01/13/2020: ESOPHAGOGASTRODUODENOSCOPY; N/A     Comment:  Procedure: ESOPHAGOGASTRODUODENOSCOPY (EGD);  Surgeon:               [name], MD;  Location: [facility] ENDOSCOPY;                Service: Endoscopy;  Laterality: N/A; 01/09/2020: ESOPHAGOGASTRODUODENOSCOPY (EGD) WITH PROPOFOL; N/A     Comment:  Procedure: ESOPHAGOGASTRODUODENOSCOPY (EGD) WITH               PROPOFOL;  Surgeon: [name], MD;  Location:               [facility] ENDOSCOPY;  Service: Endoscopy;  Laterality: N/A; 1984 & 1985: FOOT SURGERY; Bilateral 10/10/2017: HAMMER TOE SURGERY; Right  Comment:  Procedure: HAMMER TOE CORRECTION-2ND AND 3RD TOE and               4TH;  Surgeon: Samara Deist, DPM;  Location: [place];  Service: Podiatry;  Laterality: Right;                IVA LOCAL HAMMERTOE 07/13/06, 05/11/14: KNEE ARTHROSCOPY; Right No date: REPLACEMENT TOTAL KNEE; Right No date: TONSILLECTOMY  BMI    Body Mass Index: 33.20 kg/m      Reproductive/Obstetrics negative OB ROS                             Anesthesia Physical Anesthesia Plan  ASA: 3  Anesthesia Plan: General   Post-op Pain Management:    Induction: Intravenous  PONV Risk Score and Plan: Propofol infusion and TIVA  Airway Management Planned: Natural Airway  Additional Equipment:   Intra-op Plan:   Post-operative Plan:   Informed Consent: I have reviewed the patients  History and Physical, chart, labs and discussed the procedure including the risks, benefits and alternatives for the proposed anesthesia with the patient or authorized representative who has indicated his/her understanding and acceptance.     Dental Advisory Given  Plan Discussed with: CRNA and Surgeon  Anesthesia Plan Comments:         Anesthesia Quick Evaluation

## 2022-02-13 NOTE — Transfer of Care (Signed)
Immediate Anesthesia Transfer of Care Note  Patient: Alexis Atkins  Procedure(s) Performed: COLONOSCOPY  Patient Location: PACU  Anesthesia Type:MAC  Level of Consciousness: awake, alert  and oriented  Airway & Oxygen Therapy: Patient Spontanous Breathing  Post-op Assessment: Report given to RN and Post -op Vital signs reviewed and stable  Post vital signs: stable  Last Vitals:  Vitals Value Taken Time  BP 105/72 02/13/22 1122  Temp    Pulse 92 02/13/22 1122  Resp 10 02/13/22 1122  SpO2 100 % 02/13/22 1122  Vitals shown include unvalidated device data.  Last Pain: There were no vitals filed for this visit.       Complications: No notable events documented.

## 2022-02-13 NOTE — Interval H&P Note (Signed)
History and Physical Interval Note: Preprocedure H&P from 02/13/22  was reviewed and there was no interval change after seeing and examining the patient.  Written consent was obtained from the patient after discussion of risks, benefits, and alternatives. Patient has consented to proceed with Colonoscopy with possible intervention   02/13/2022 10:40 AM  Alexis Atkins  has presented today for surgery, with the diagnosis of Rectal bleeding (K62.5) Chronic constipation (K59.09) Change in bowel habits (R19.4) Fecal smearing (R15.1).  The various methods of treatment have been discussed with the patient and family. After consideration of risks, benefits and other options for treatment, the patient has consented to  Procedure(s): COLONOSCOPY (N/A) as a surgical intervention.  The patient's history has been reviewed, patient examined, no change in status, stable for surgery.  I have reviewed the patient's chart and labs.  Questions were answered to the patient's satisfaction.     Annamaria Helling

## 2022-02-13 NOTE — Op Note (Signed)
[facility] Gastroenterology Patient Name: Alexis Atkins Procedure Date: 02/13/2022 10:27 AM MRN: [id number] Account #: [id number] Date of Birth: 1951/04/06 Admit Type: Outpatient Age: 71 Room: [facility] ENDO ROOM 2 Gender: Female Note Status: Finalized Instrument Name: Colonscope [id number] Procedure:             Colonoscopy Indications:           Rectal bleeding Providers:             Rueben Bash, DO Referring MD:          Ocie Cornfield. Ouida Sills MD, MD (Referring MD) Medicines:             Monitored Anesthesia Care Complications:         No immediate complications. Estimated blood loss:                         Minimal. Procedure:             Pre-Anesthesia Assessment:                        - Prior to the procedure, a History and Physical was                         performed, and patient medications and allergies were                         reviewed. The patient is competent. The risks and                         benefits of the procedure and the sedation options and                         risks were discussed with the patient. All questions                         were answered and informed consent was obtained.                         Patient identification and proposed procedure were                         verified by the physician, the nurse, the anesthetist                         and the technician in the endoscopy suite. Mental                         Status Examination: alert and oriented. Airway                         Examination: normal oropharyngeal airway and neck                         mobility. Respiratory Examination: clear to                         auscultation. CV Examination: RRR, no murmurs, no S3  or S4. Prophylactic Antibiotics: The patient does not                         require prophylactic antibiotics. Prior                         Anticoagulants: The patient has taken Xarelto                          (rivaroxaban), last dose was 4 days prior to                         procedure. ASA Grade Assessment: III - A patient with                         severe systemic disease. After reviewing the risks and                         benefits, the patient was deemed in satisfactory                         condition to undergo the procedure. The anesthesia                         plan was to use monitored anesthesia care (MAC).                         Immediately prior to administration of medications,                         the patient was re-assessed for adequacy to receive                         sedatives. The heart rate, respiratory rate, oxygen                         saturations, blood pressure, adequacy of pulmonary                         ventilation, and response to care were monitored                         throughout the procedure. The physical status of the                         patient was re-assessed after the procedure.                        After obtaining informed consent, the colonoscope was                         passed under direct vision. Throughout the procedure,                         the patient's blood pressure, pulse, and oxygen                         saturations were monitored continuously. The  Colonoscope was introduced through the anus and                         advanced to the the cecum, identified by appendiceal                         orifice and ileocecal valve. The colonoscopy was                         performed without difficulty. The patient tolerated                         the procedure well. The quality of the bowel                         preparation was evaluated using the BBPS [address] [employer] Bowel                         Preparation Scale) with scores of: Right Colon = 2                         (minor amount of residual staining, small fragments of                         stool and/or opaque liquid, but mucosa seen well),                          Transverse Colon = 3 (entire mucosa seen well with no                         residual staining, small fragments of stool or opaque                         liquid) and Left Colon = 3 (entire mucosa seen well                         with no residual staining, small fragments of stool or                         opaque liquid). The total BBPS score equals 8. The                         quality of the bowel preparation was excellent. The                         ileocecal valve, appendiceal orifice, and rectum were                         photographed. Findings:      The perianal and digital rectal examinations were normal. Pertinent       negatives include normal sphincter tone.      Multiple small-mouthed diverticula were found in the entire colon.       Estimated blood loss: none.      A 1 to 2 mm polyp was found in the rectum. The polyp was sessile. The       polyp was removed with a jumbo cold  forceps. Resection and retrieval       were complete. Estimated blood loss was minimal.      Two sessile polyps were found in the rectum. The polyps were 2 to 3 mm       in size. These polyps were removed with a cold snare. Resection and       retrieval were complete. Estimated blood loss was minimal.      Non-bleeding internal hemorrhoids were found during retroflexion. The       hemorrhoids were Grade I (internal hemorrhoids that do not prolapse).       Estimated blood loss: none.      The exam was otherwise without abnormality on direct and retroflexion       views. Impression:            - Diverticulosis in the entire examined colon.                        - One 1 to 2 mm polyp in the rectum, removed with a                         jumbo cold forceps. Resected and retrieved.                        - Two 2 to 3 mm polyps in the rectum, removed with a                         cold snare. Resected and retrieved.                        - Non-bleeding internal hemorrhoids.                         - The examination was otherwise normal on direct and                         retroflexion views. Recommendation:        - Patient has a contact number available for                         emergencies. The signs and symptoms of potential                         delayed complications were discussed with the patient.                         Return to normal activities tomorrow. Written                         discharge instructions were provided to the patient.                        - Discharge patient to home.                        - Resume previous diet.                        - Continue present medications.                        -  Await pathology results.                        - Repeat colonoscopy for surveillance based on                         pathology results.                        - Return to GI office as previously scheduled.                        - Consider hemorrhoid banding or hemorrhoidectomy if                         bleeding continues to be an issue                        - The findings and recommendations were discussed with                         the patient. Procedure Code(s):     --- Professional ---                        [phone], Colonoscopy, flexible; with removal of                         tumor(s), polyp(s), or other lesion(s) by snare                         technique                        [id number], 51, Colonoscopy, flexible; with biopsy, single                         or multiple Diagnosis Code(s):     --- Professional ---                        K64.0, First degree hemorrhoids                        K62.1, Rectal polyp                        K62.5, Hemorrhage of anus and rectum                        K57.30, Diverticulosis of large intestine without                         perforation or abscess without bleeding CPT copyright 2019 [employer]. All rights reserved. The codes documented in this report are preliminary and upon coder  review may  be revised to meet current compliance requirements. Attending Participation:      I personally performed the entire procedure. Volney American, DO Annamaria Helling DO, DO 02/13/2022 11:25:08 AM This report has been signed electronically. Number of Addenda: 0 Note Initiated On: 02/13/2022 10:27 AM Scope Withdrawal Time: 0 hours 18 minutes 5 seconds  Total Procedure Duration: 0 hours 28 minutes 33 seconds  Estimated Blood Loss:  Estimated blood loss was minimal.      [facility]

## 2022-02-13 NOTE — Anesthesia Postprocedure Evaluation (Signed)
Anesthesia Post Note  Patient: Alexis Atkins  Procedure(s) Performed: COLONOSCOPY  Patient location during evaluation: PACU Anesthesia Type: General Level of consciousness: awake and oriented Pain management: pain level controlled Vital Signs Assessment: post-procedure vital signs reviewed and stable Respiratory status: spontaneous breathing Anesthetic complications: no   No notable events documented.   Last Vitals:  Vitals:   02/13/22 0941  BP: 135/83  Pulse: 88  Resp: 16  Temp: (!) 35.8 C  SpO2: 100%    Last Pain:  Vitals:   02/13/22 1152  PainSc: 0-No pain                 VAN STAVEREN,Brittne Kawasaki

## 2022-02-14 ENCOUNTER — Encounter: Payer: Self-pay | Admitting: Gastroenterology

## 2022-02-14 LAB — SURGICAL PATHOLOGY

## 2024-04-07 ENCOUNTER — Other Ambulatory Visit
Admission: RE | Admit: 2024-04-07 | Discharge: 2024-04-07 | Disposition: A | Discharge status: Home / Self Care | Source: Ambulatory Visit | Attending: Internal Medicine | Admitting: Internal Medicine

## 2024-04-07 DIAGNOSIS — R079 Chest pain, unspecified: Secondary | ICD-10-CM | POA: Insufficient documentation

## 2024-04-07 LAB — TROPONIN I (HIGH SENSITIVITY): Troponin I (High Sensitivity): 7 ng/L (ref <18)

## 2024-05-05 ENCOUNTER — Other Ambulatory Visit: Payer: Self-pay | Admitting: Physician Assistant

## 2024-05-05 ENCOUNTER — Ambulatory Visit
Admission: RE | Admit: 2024-05-05 | Discharge: 2024-05-05 | Disposition: A | Source: Ambulatory Visit | Attending: Physician Assistant

## 2024-05-05 DIAGNOSIS — J040 Acute laryngitis: Secondary | ICD-10-CM

## 2024-05-05 DIAGNOSIS — R0781 Pleurodynia: Secondary | ICD-10-CM

## 2024-05-05 DIAGNOSIS — R0602 Shortness of breath: Secondary | ICD-10-CM

## 2024-05-05 MED ORDER — IOHEXOL 350 MG/ML SOLN
75.0000 mL | Freq: Once | INTRAVENOUS | Status: AC | PRN
  Administered 2024-05-05: 75 mL via INTRAVENOUS

## 2024-05-19 ENCOUNTER — Other Ambulatory Visit: Payer: Self-pay | Admitting: Cardiology

## 2024-05-19 DIAGNOSIS — I209 Angina pectoris, unspecified: Secondary | ICD-10-CM

## 2024-05-19 DIAGNOSIS — I10 Essential (primary) hypertension: Secondary | ICD-10-CM

## 2024-05-19 DIAGNOSIS — Z86711 Personal history of pulmonary embolism: Secondary | ICD-10-CM

## 2024-05-19 DIAGNOSIS — R0602 Shortness of breath: Secondary | ICD-10-CM

## 2024-05-19 DIAGNOSIS — R0789 Other chest pain: Secondary | ICD-10-CM

## 2024-05-19 DIAGNOSIS — Z853 Personal history of malignant neoplasm of breast: Secondary | ICD-10-CM

## 2024-05-19 DIAGNOSIS — Z923 Personal history of irradiation: Secondary | ICD-10-CM

## 2024-06-06 ENCOUNTER — Telehealth (HOSPITAL_COMMUNITY): Payer: Self-pay | Admitting: Emergency Medicine

## 2024-06-06 NOTE — Telephone Encounter (Signed)
 Reaching out to patient to offer assistance regarding upcoming cardiac imaging study; pt verbalizes understanding of appt date/time, parking situation and where to check in, pre-test NPO status and medications ordered, and verified current allergies; name and call back number provided for further questions should they arise Rockwell Alexandria RN Navigator Cardiac Imaging [facility] [phone] office [phone] cell

## 2024-06-09 ENCOUNTER — Ambulatory Visit
Admission: RE | Admit: 2024-06-09 | Discharge: 2024-06-09 | Disposition: A | Discharge status: Home / Self Care | Source: Ambulatory Visit | Attending: Cardiology | Admitting: Cardiology

## 2024-06-09 DIAGNOSIS — R0789 Other chest pain: Secondary | ICD-10-CM | POA: Insufficient documentation

## 2024-06-09 DIAGNOSIS — Z86711 Personal history of pulmonary embolism: Secondary | ICD-10-CM | POA: Diagnosis present

## 2024-06-09 DIAGNOSIS — Z853 Personal history of malignant neoplasm of breast: Secondary | ICD-10-CM | POA: Insufficient documentation

## 2024-06-09 DIAGNOSIS — I209 Angina pectoris, unspecified: Secondary | ICD-10-CM | POA: Insufficient documentation

## 2024-06-09 DIAGNOSIS — I10 Essential (primary) hypertension: Secondary | ICD-10-CM | POA: Diagnosis present

## 2024-06-09 DIAGNOSIS — R0602 Shortness of breath: Secondary | ICD-10-CM | POA: Diagnosis present

## 2024-06-09 DIAGNOSIS — Z923 Personal history of irradiation: Secondary | ICD-10-CM | POA: Insufficient documentation

## 2024-06-09 MED ORDER — NITROGLYCERIN 0.4 MG SL SUBL
0.8000 mg | SUBLINGUAL_TABLET | Freq: Once | SUBLINGUAL | Status: AC
  Administered 2024-06-09: 0.8 mg via SUBLINGUAL

## 2024-06-09 MED ORDER — DILTIAZEM HCL 25 MG/5ML IV SOLN: 10.0000 mg | INTRAVENOUS | Status: DC | PRN

## 2024-06-09 MED ORDER — METOPROLOL TARTRATE 5 MG/5ML IV SOLN: 10.0000 mg | Freq: Once | INTRAVENOUS | Status: DC | PRN

## 2024-06-09 MED ORDER — IOHEXOL 350 MG/ML SOLN
100.0000 mL | Freq: Once | INTRAVENOUS | Status: AC | PRN
  Administered 2024-06-09: 100 mL via INTRAVENOUS

## 2024-06-09 NOTE — Progress Notes (Signed)
 Patient tolerated procedure well. Ambulate w/o difficulty. Denies any lightheadedness or being dizzy. Pt denies any pain at this time. Sitting in chair. Pt is encouraged to drink additional water throughout the day and reason explained to patient. Patient verbalized understanding and all questions answered. ABC intact. No further needs at this time. Discharge from procedure area w/o issues.
